# Patient Record
Sex: Female | Born: 1950 | Race: White | Hispanic: No | Marital: Married | Smoking: Never smoker
Health system: Southern US, Community
[De-identification: ages and names within clinical notes are randomized; demographics above are authoritative.]

## PROBLEM LIST (undated history)

## (undated) DIAGNOSIS — K5790 Diverticulosis of intestine, part unspecified, without perforation or abscess without bleeding: Secondary | ICD-10-CM

## (undated) DIAGNOSIS — C801 Malignant (primary) neoplasm, unspecified: Secondary | ICD-10-CM

## (undated) DIAGNOSIS — D219 Benign neoplasm of connective and other soft tissue, unspecified: Secondary | ICD-10-CM

## (undated) DIAGNOSIS — R011 Cardiac murmur, unspecified: Secondary | ICD-10-CM

## (undated) DIAGNOSIS — K8020 Calculus of gallbladder without cholecystitis without obstruction: Secondary | ICD-10-CM

## (undated) DIAGNOSIS — I10 Essential (primary) hypertension: Secondary | ICD-10-CM

## (undated) DIAGNOSIS — N739 Female pelvic inflammatory disease, unspecified: Secondary | ICD-10-CM

## (undated) DIAGNOSIS — M19049 Primary osteoarthritis, unspecified hand: Secondary | ICD-10-CM

## (undated) DIAGNOSIS — R896 Abnormal cytological findings in specimens from other organs, systems and tissues: Secondary | ICD-10-CM

## (undated) DIAGNOSIS — K859 Acute pancreatitis without necrosis or infection, unspecified: Secondary | ICD-10-CM

## (undated) HISTORY — DX: Acute pancreatitis without necrosis or infection, unspecified: K85.90

## (undated) HISTORY — DX: Benign neoplasm of connective and other soft tissue, unspecified: D21.9

## (undated) HISTORY — PX: NASAL SEPTUM SURGERY: SHX37

## (undated) HISTORY — DX: Primary osteoarthritis, unspecified hand: M19.049

## (undated) HISTORY — DX: Essential (primary) hypertension: I10

## (undated) HISTORY — PX: CHOLECYSTECTOMY: SHX55

## (undated) HISTORY — DX: Abnormal cytological findings in specimens from other organs, systems and tissues: R89.6

## (undated) HISTORY — DX: Diverticulosis of intestine, part unspecified, without perforation or abscess without bleeding: K57.90

## (undated) HISTORY — PX: TOOTH EXTRACTION: SUR596

## (undated) HISTORY — PX: COLONOSCOPY: SHX174

## (undated) HISTORY — DX: Malignant (primary) neoplasm, unspecified: C80.1

## (undated) HISTORY — PX: PLANTAR FASCIA RELEASE: SHX2239

## (undated) HISTORY — DX: Cardiac murmur, unspecified: R01.1

## (undated) HISTORY — DX: Female pelvic inflammatory disease, unspecified: N73.9

## (undated) HISTORY — DX: Calculus of gallbladder without cholecystitis without obstruction: K80.20

---

## 1984-03-18 DIAGNOSIS — N739 Female pelvic inflammatory disease, unspecified: Secondary | ICD-10-CM

## 1984-03-18 HISTORY — DX: Female pelvic inflammatory disease, unspecified: N73.9

## 1992-03-18 DIAGNOSIS — IMO0001 Reserved for inherently not codable concepts without codable children: Secondary | ICD-10-CM

## 1992-03-18 HISTORY — DX: Reserved for inherently not codable concepts without codable children: IMO0001

## 1996-03-18 DIAGNOSIS — D219 Benign neoplasm of connective and other soft tissue, unspecified: Secondary | ICD-10-CM

## 1996-03-18 HISTORY — DX: Benign neoplasm of connective and other soft tissue, unspecified: D21.9

## 1998-09-16 HISTORY — PX: BLEPHAROPLASTY: SUR158

## 1998-11-15 ENCOUNTER — Other Ambulatory Visit: Admission: RE | Admit: 1998-11-15 | Discharge: 1998-11-15 | Payer: Self-pay | Admitting: Obstetrics and Gynecology

## 1999-11-15 ENCOUNTER — Other Ambulatory Visit: Admission: RE | Admit: 1999-11-15 | Discharge: 1999-11-15 | Payer: Self-pay | Admitting: Obstetrics and Gynecology

## 2001-03-25 ENCOUNTER — Other Ambulatory Visit: Admission: RE | Admit: 2001-03-25 | Discharge: 2001-03-25 | Payer: Self-pay | Admitting: Obstetrics and Gynecology

## 2002-03-18 HISTORY — PX: CARPAL TUNNEL RELEASE: SHX101

## 2002-04-27 ENCOUNTER — Other Ambulatory Visit: Admission: RE | Admit: 2002-04-27 | Discharge: 2002-04-27 | Payer: Self-pay | Admitting: Obstetrics and Gynecology

## 2003-04-13 ENCOUNTER — Ambulatory Visit (HOSPITAL_COMMUNITY): Admission: RE | Admit: 2003-04-13 | Discharge: 2003-04-13 | Payer: Self-pay | Admitting: Pulmonary Disease

## 2004-05-13 ENCOUNTER — Emergency Department (HOSPITAL_COMMUNITY): Admission: EM | Admit: 2004-05-13 | Discharge: 2004-05-13 | Payer: Self-pay | Admitting: Family Medicine

## 2004-09-24 ENCOUNTER — Ambulatory Visit: Payer: Self-pay | Admitting: Pulmonary Disease

## 2004-11-14 ENCOUNTER — Other Ambulatory Visit: Admission: RE | Admit: 2004-11-14 | Discharge: 2004-11-14 | Payer: Self-pay | Admitting: Obstetrics and Gynecology

## 2004-12-15 IMAGING — CT CT PARANASAL SINUSES LIMITED
1 series · 15 of 30 positions shown, 19 images · IV contrast (omnipaque)
Comparison: none

CLINICAL DATA: Dyspnea.  Chronic sinusitis.  Congestion, wheezing.
TECHNIQUE: Multidetector coronal imaging through the paranasal sinuses performed.  Multidetector helical CT scanning through the chest performed following administration of 100 cc of intravenous Omnipaque 300.  High resolution images of the lungs also performed. 
CT LIMITED SINUSES WITHOUT CONTRAST 04/13/03 
A small amount of mucus in the inferior right maxillary sinus noted.  The remainder of the paranasal sinuses are clear.  Evidence of postsurgical changes in the paranasal sinuses and osteomeatal complexes is noted.  
IMPRESSION
1.  Small amount of mucus in the inferior/anterior right maxillary sinus without other evidence of paranasal sinusitis.
2.  Evidence of previous bilateral sinus/osteomeatal complex surgery.
CT CHEST WITH CONTRAST WITH HIGH RESOLUTION IMAGES OF THE LUNGS 04/13/03 
Heart and great vessels are unremarkable.  No enlarged lymph nodes or pericardial/pleural effusions.  Mild peribronchial thickening is noted without evidence of other interstitial opacities.  No evidence of pulmonary nodules, interlobular septal thickening, or cystic changes.  No focal airspace disease. 
Mild peribronchial thickening without other significant abnormality.  No other evidence or interstitial or airspace disease.

[Series 4: sinus ltd 2.5 h30s · axial · 0.23mm/px · z∈[+1249,+1348]mm · 15 of 40 slices shown, 19 images]
[im 2/40  brain]
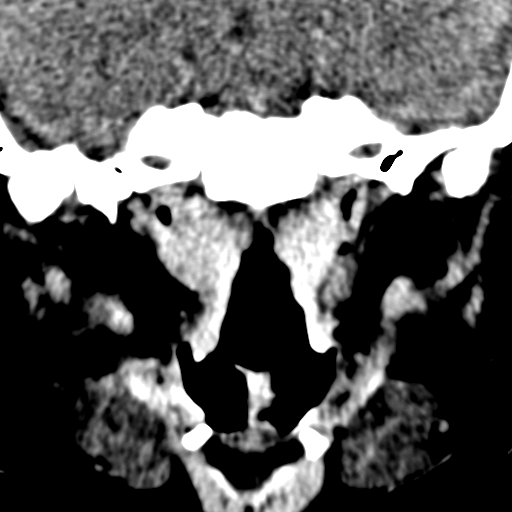
[im 2/40  bone]
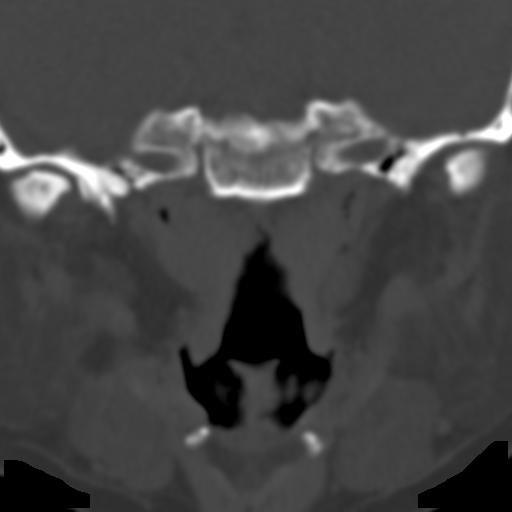
[im 5/40  bone]
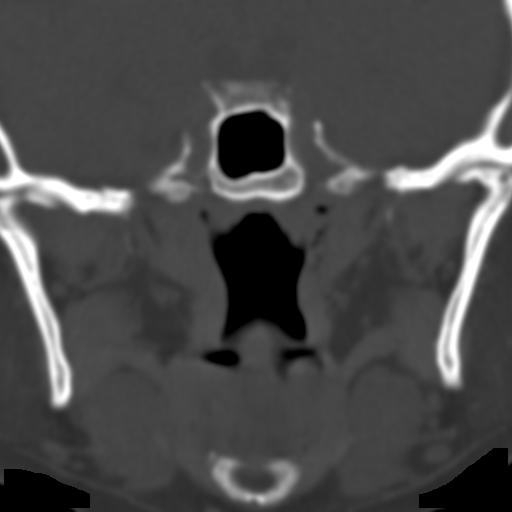
[im 7/40  bone]
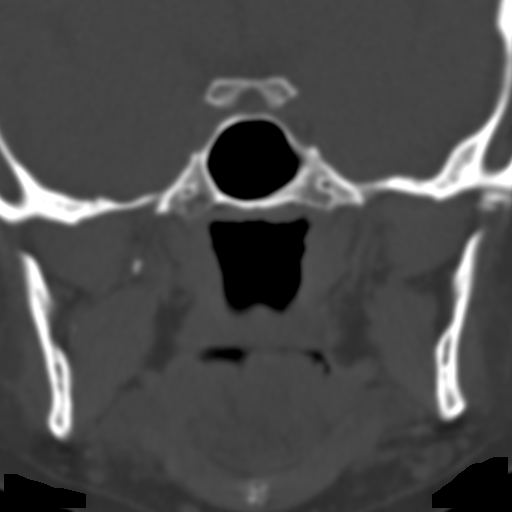
[im 10/40  bone]
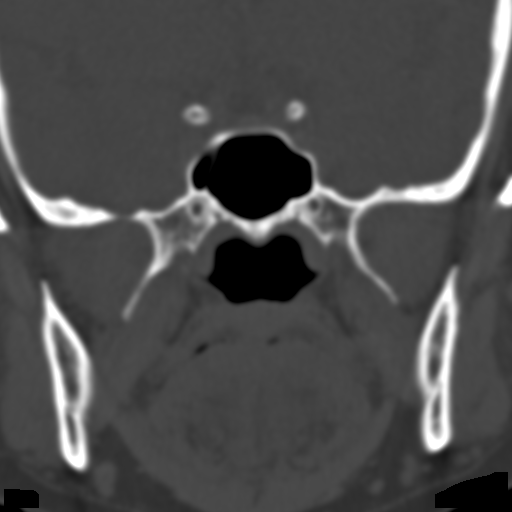
[im 13/40  brain]
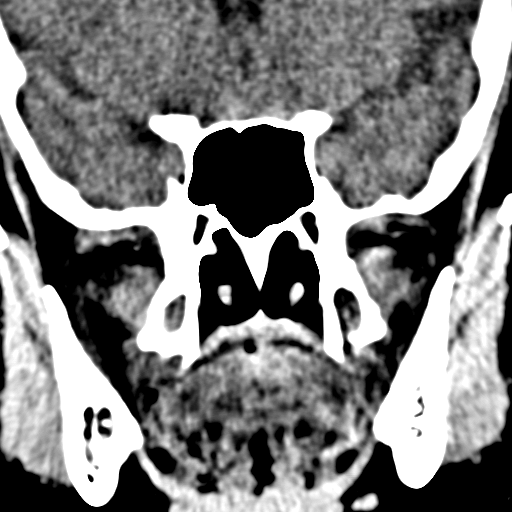
[im 13/40  bone]
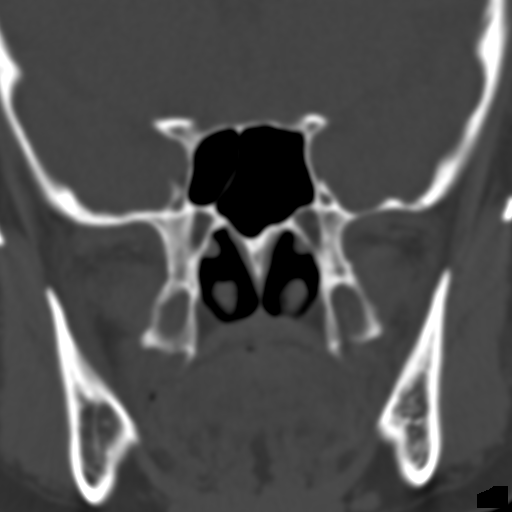
[im 15/40  bone]
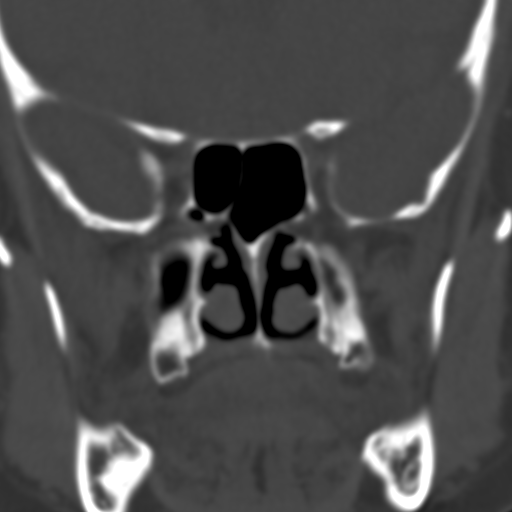
[im 18/40  bone]
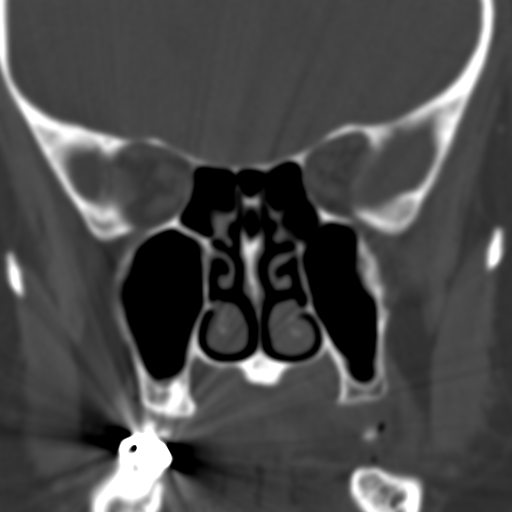
[im 21/40  bone]
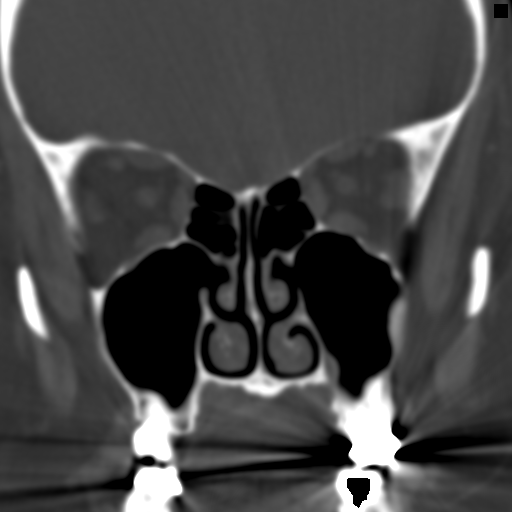
[im 22/40  brain]
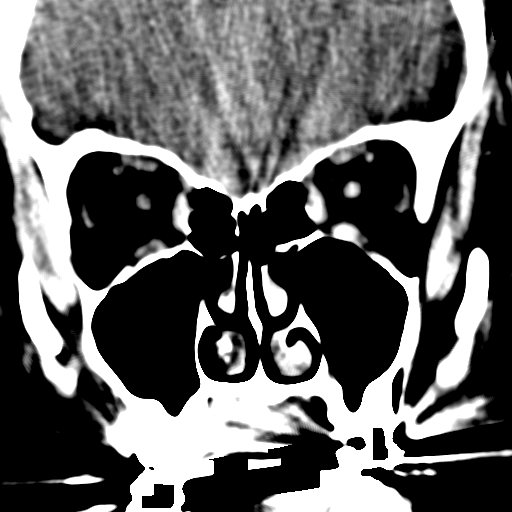
[im 22/40  bone]
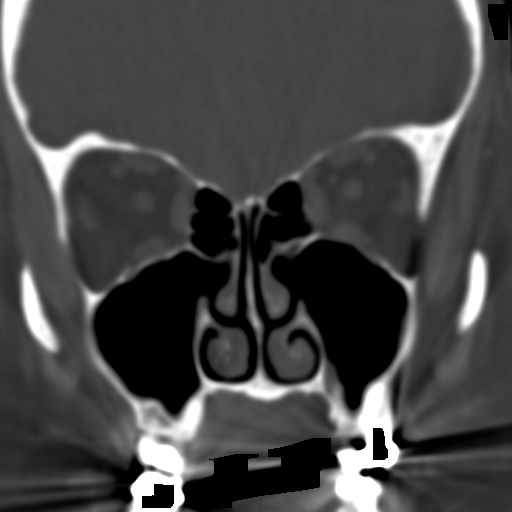
[im 25/40  bone]
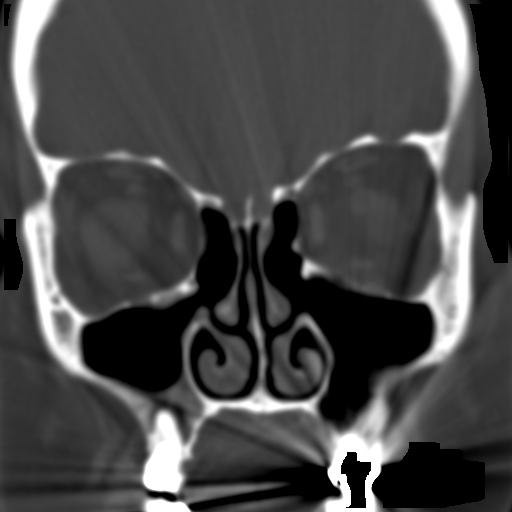
[im 27/40  bone]
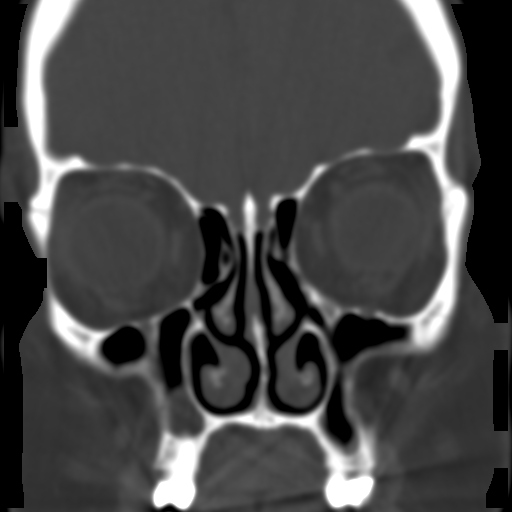
[im 30/40  bone]
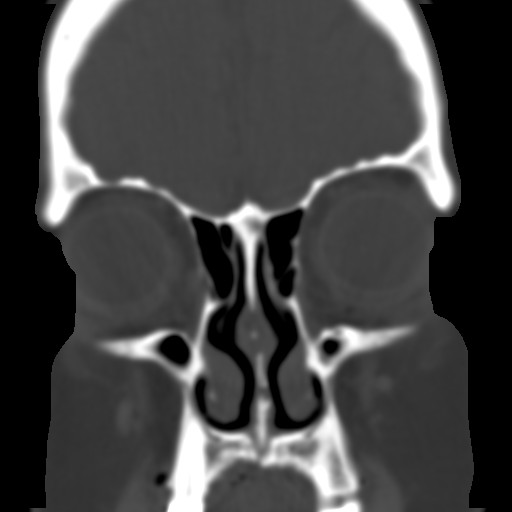
[im 33/40  brain]
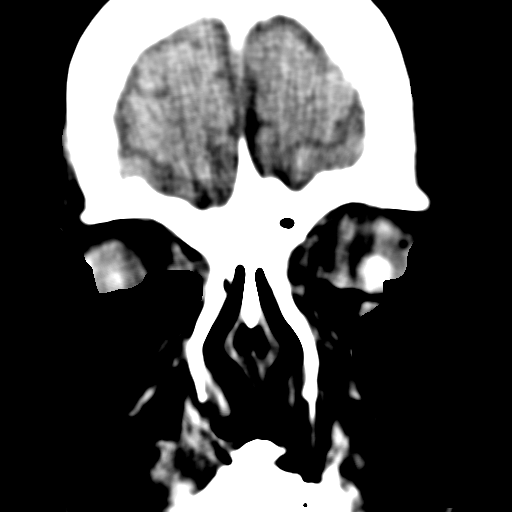
[im 33/40  bone]
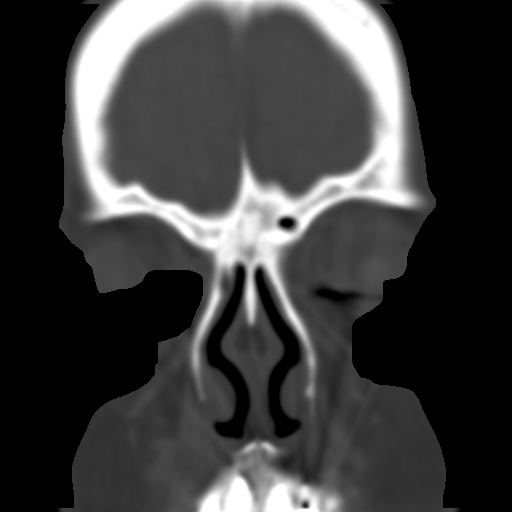
[im 35/40  bone]
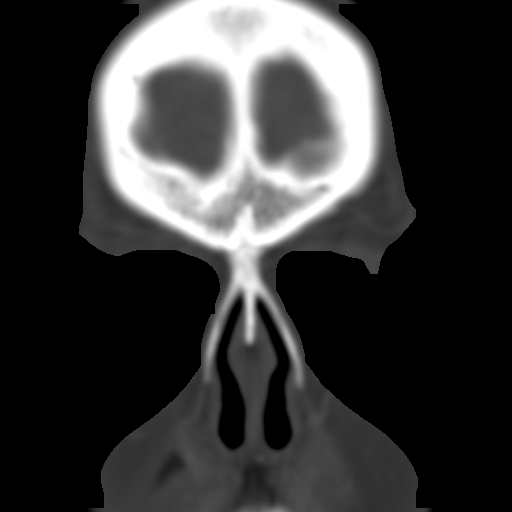
[im 38/40  bone]
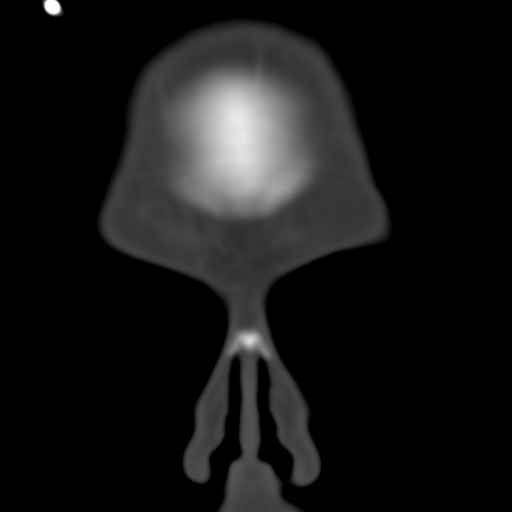

[15 of 30 positions shown; findings below may reference images not displayed]

## 2005-04-18 ENCOUNTER — Ambulatory Visit: Payer: Self-pay | Admitting: Pulmonary Disease

## 2005-07-02 ENCOUNTER — Ambulatory Visit: Payer: Self-pay | Admitting: Pulmonary Disease

## 2005-07-30 ENCOUNTER — Ambulatory Visit: Payer: Self-pay | Admitting: Pulmonary Disease

## 2005-11-15 ENCOUNTER — Other Ambulatory Visit: Admission: RE | Admit: 2005-11-15 | Discharge: 2005-11-15 | Payer: Self-pay | Admitting: Obstetrics and Gynecology

## 2006-06-26 ENCOUNTER — Ambulatory Visit: Payer: Self-pay | Admitting: Pulmonary Disease

## 2006-08-21 ENCOUNTER — Ambulatory Visit: Payer: Self-pay | Admitting: Internal Medicine

## 2006-09-23 ENCOUNTER — Ambulatory Visit: Payer: Self-pay | Admitting: Internal Medicine

## 2007-03-10 ENCOUNTER — Other Ambulatory Visit: Admission: RE | Admit: 2007-03-10 | Discharge: 2007-03-10 | Payer: Self-pay | Admitting: Obstetrics and Gynecology

## 2007-03-19 DIAGNOSIS — C801 Malignant (primary) neoplasm, unspecified: Secondary | ICD-10-CM

## 2007-03-19 HISTORY — DX: Malignant (primary) neoplasm, unspecified: C80.1

## 2007-04-06 DIAGNOSIS — J309 Allergic rhinitis, unspecified: Secondary | ICD-10-CM | POA: Insufficient documentation

## 2007-04-07 ENCOUNTER — Ambulatory Visit: Payer: Self-pay | Admitting: Internal Medicine

## 2007-04-07 DIAGNOSIS — J45909 Unspecified asthma, uncomplicated: Secondary | ICD-10-CM | POA: Insufficient documentation

## 2007-04-20 ENCOUNTER — Encounter: Payer: Self-pay | Admitting: Obstetrics and Gynecology

## 2007-04-20 ENCOUNTER — Ambulatory Visit (HOSPITAL_BASED_OUTPATIENT_CLINIC_OR_DEPARTMENT_OTHER): Admission: RE | Admit: 2007-04-20 | Discharge: 2007-04-20 | Payer: Self-pay | Admitting: Obstetrics and Gynecology

## 2007-05-06 ENCOUNTER — Ambulatory Visit: Admission: RE | Admit: 2007-05-06 | Discharge: 2007-05-06 | Payer: Self-pay | Admitting: Gynecology

## 2007-05-26 ENCOUNTER — Encounter: Payer: Self-pay | Admitting: Gynecology

## 2007-05-26 ENCOUNTER — Inpatient Hospital Stay (HOSPITAL_COMMUNITY): Admission: RE | Admit: 2007-05-26 | Discharge: 2007-05-28 | Payer: Self-pay | Admitting: Obstetrics & Gynecology

## 2007-05-26 HISTORY — PX: ABDOMINAL HYSTERECTOMY: SHX81

## 2007-07-07 ENCOUNTER — Ambulatory Visit: Admission: RE | Admit: 2007-07-07 | Discharge: 2007-07-07 | Payer: Self-pay | Admitting: Gynecologic Oncology

## 2007-09-25 ENCOUNTER — Encounter: Payer: Self-pay | Admitting: Gynecology

## 2007-09-25 ENCOUNTER — Other Ambulatory Visit: Admission: RE | Admit: 2007-09-25 | Discharge: 2007-09-25 | Payer: Self-pay | Admitting: Gynecology

## 2007-09-25 ENCOUNTER — Ambulatory Visit: Admission: RE | Admit: 2007-09-25 | Discharge: 2007-09-25 | Payer: Self-pay | Admitting: Gynecology

## 2007-12-28 ENCOUNTER — Other Ambulatory Visit: Admission: RE | Admit: 2007-12-28 | Discharge: 2007-12-28 | Payer: Self-pay | Admitting: Obstetrics and Gynecology

## 2008-03-15 ENCOUNTER — Other Ambulatory Visit: Admission: RE | Admit: 2008-03-15 | Discharge: 2008-03-15 | Payer: Self-pay | Admitting: Obstetrics and Gynecology

## 2008-04-01 ENCOUNTER — Ambulatory Visit: Admission: RE | Admit: 2008-04-01 | Discharge: 2008-04-01 | Payer: Self-pay | Admitting: Gynecology

## 2008-04-01 ENCOUNTER — Other Ambulatory Visit: Admission: RE | Admit: 2008-04-01 | Discharge: 2008-04-01 | Payer: Self-pay | Admitting: Gynecology

## 2008-04-01 ENCOUNTER — Encounter: Payer: Self-pay | Admitting: Gynecology

## 2008-11-25 ENCOUNTER — Ambulatory Visit: Admission: RE | Admit: 2008-11-25 | Discharge: 2008-11-25 | Payer: Self-pay | Admitting: Gynecology

## 2008-11-25 ENCOUNTER — Encounter: Payer: Self-pay | Admitting: Gynecology

## 2008-11-25 ENCOUNTER — Other Ambulatory Visit: Admission: RE | Admit: 2008-11-25 | Discharge: 2008-11-25 | Payer: Self-pay | Admitting: Gynecology

## 2008-12-06 ENCOUNTER — Encounter: Admission: RE | Admit: 2008-12-06 | Discharge: 2008-12-06 | Payer: Self-pay | Admitting: Neurosurgery

## 2008-12-21 ENCOUNTER — Encounter (INDEPENDENT_AMBULATORY_CARE_PROVIDER_SITE_OTHER): Payer: Self-pay | Admitting: *Deleted

## 2009-01-23 IMAGING — CR DG CHEST 2V
2 series · 2 of 2 positions shown · non-contrast
Comparison: 08/21/06

CLINICAL DATA: 56 year old, preop respiratory exam.   Endometrial cancer.
 CHEST ? 2 VIEW:

[w chest pa]
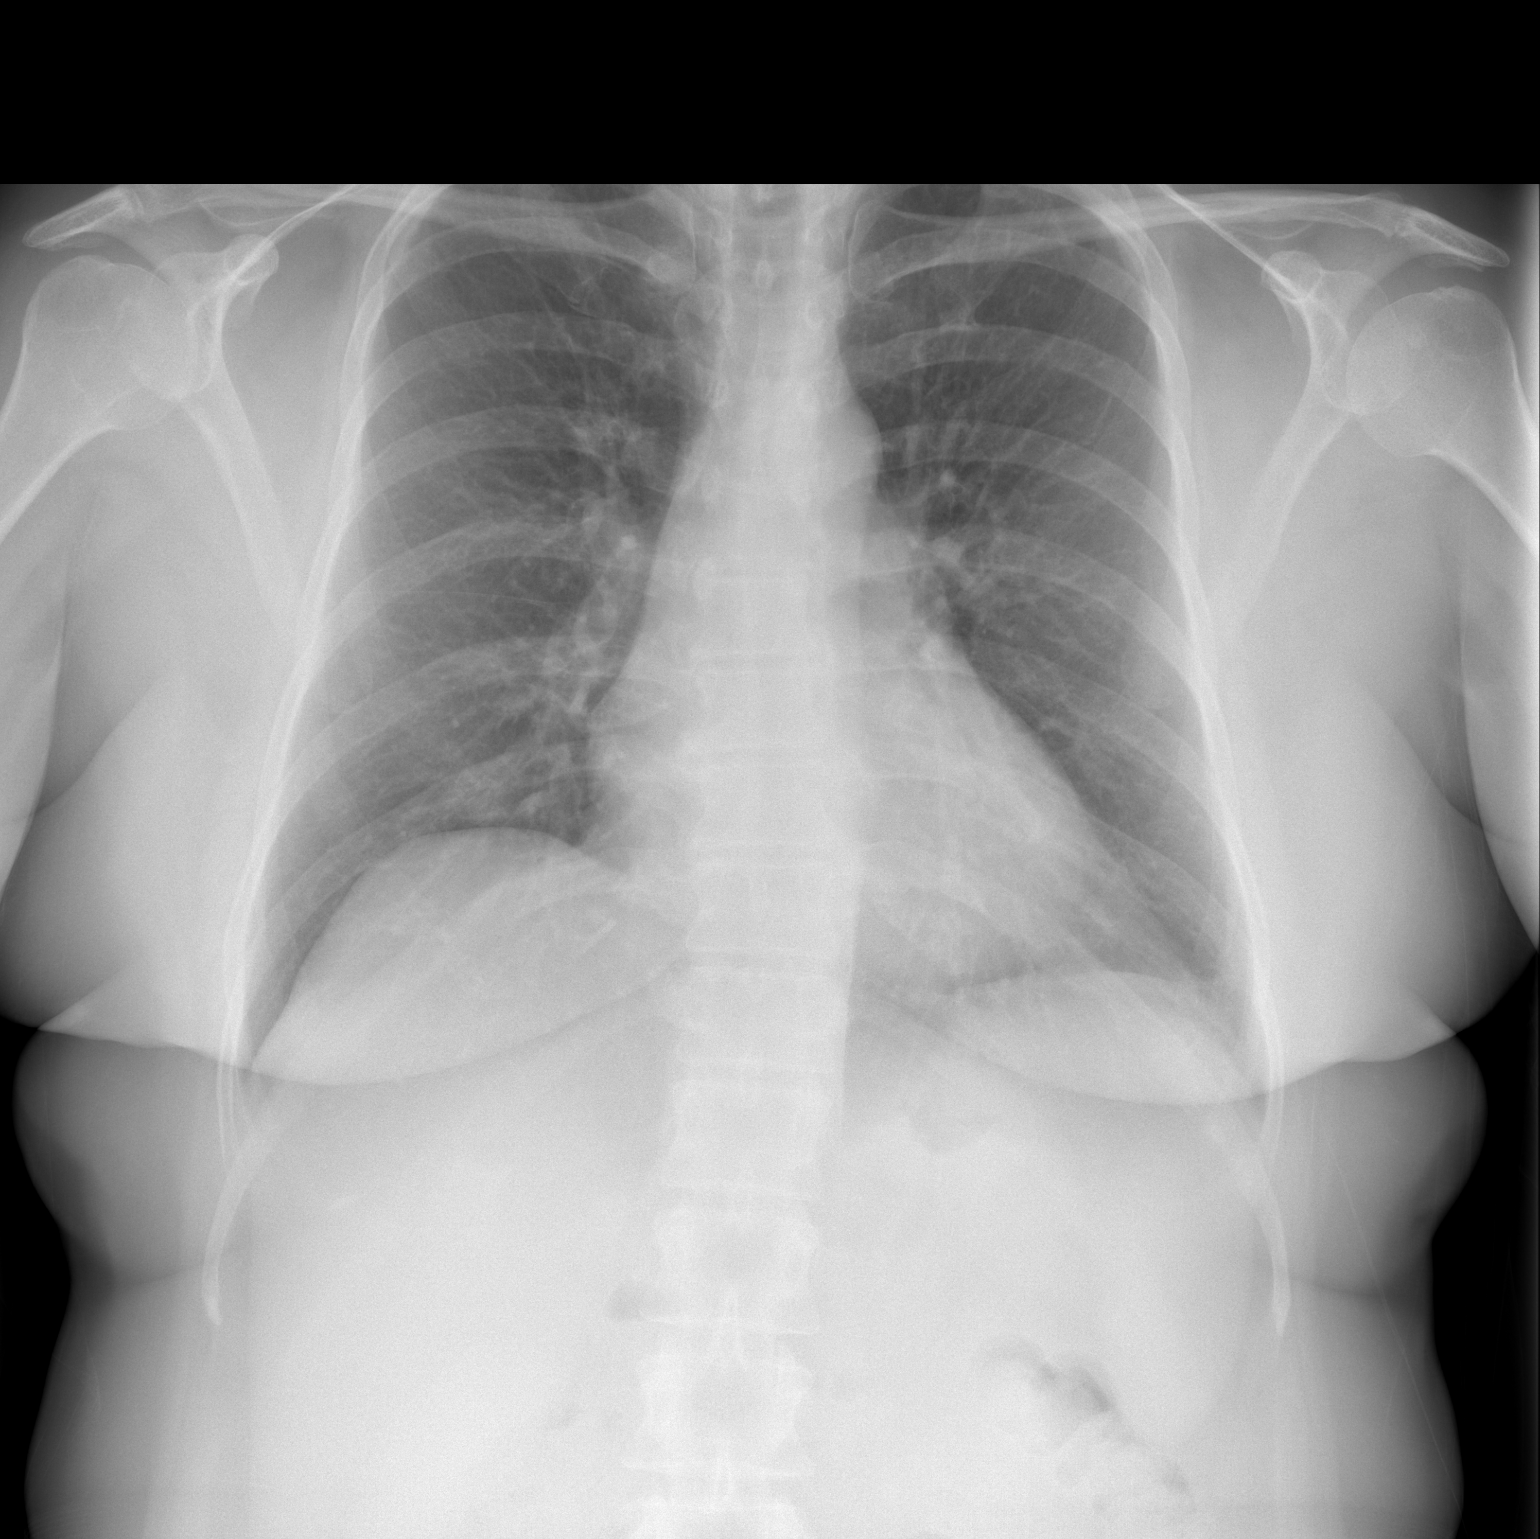

[w chest lat]
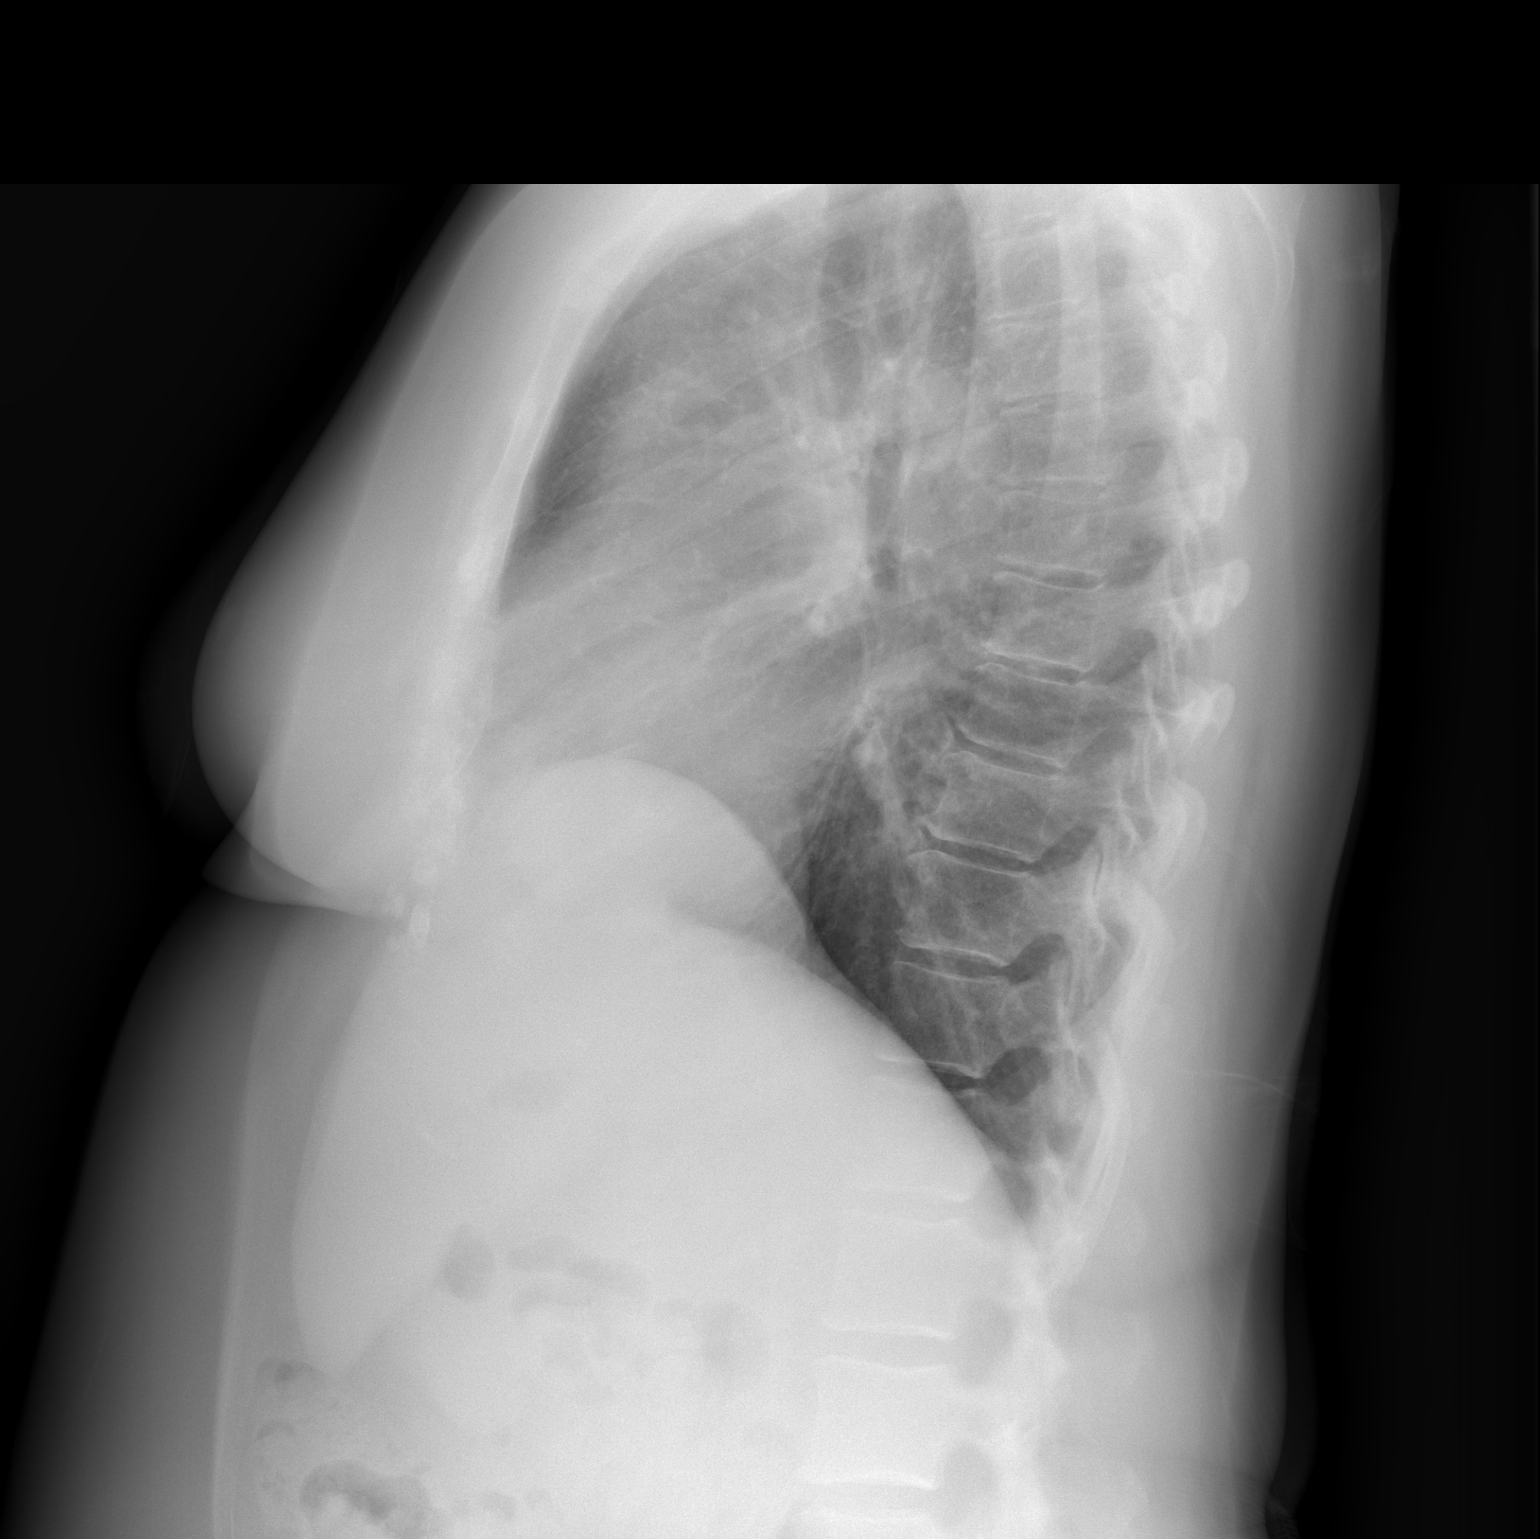

[2 of 2 positions shown; findings below may reference images not displayed]

FINDINGS: The cardiac silhouette, mediastinal and hilar contours are within normal limits and stable.  There are mild chronic bronchitic changes in the lungs but no acute pulmonary findings.  Stable mild eventration of the right hemidiaphragm.  The bony thorax is intact.
IMPRESSION: Mild chronic bronchitic changes which may be related to history of asthma.  No acute pulmonary findings.

## 2009-01-24 ENCOUNTER — Ambulatory Visit: Payer: Self-pay | Admitting: Internal Medicine

## 2009-02-16 ENCOUNTER — Encounter (INDEPENDENT_AMBULATORY_CARE_PROVIDER_SITE_OTHER): Payer: Self-pay | Admitting: *Deleted

## 2009-02-20 ENCOUNTER — Ambulatory Visit: Payer: Self-pay | Admitting: Internal Medicine

## 2009-03-21 ENCOUNTER — Ambulatory Visit: Payer: Self-pay | Admitting: Internal Medicine

## 2009-06-09 ENCOUNTER — Other Ambulatory Visit: Admission: RE | Admit: 2009-06-09 | Discharge: 2009-06-09 | Payer: Self-pay | Admitting: Gynecology

## 2009-06-09 ENCOUNTER — Ambulatory Visit: Admission: RE | Admit: 2009-06-09 | Discharge: 2009-06-09 | Payer: Self-pay | Admitting: Gynecology

## 2010-04-17 NOTE — Procedures (Signed)
Summary: Colonoscopy  Patient: Valerie Wilson Note: All result statuses are Final unless otherwise noted.  Tests: (1) Colonoscopy (COL)   COL Colonoscopy           DONE (C)     [facility]     [address]     [place], [place]  [address]           COLONOSCOPY PROCEDURE REPORT           PATIENT:  [name], [name]  MR#:  [id number]     BIRTHDATE:  [birth year], 58 yrs. old  GENDER:  female           ENDOSCOPIST:  Hedwig Morton. Juanda Chance, MD     Referred by:  Benedetto Goad, MD           PROCEDURE DATE:  03/21/2009     PROCEDURE:  Colonoscopy 19[address]     ASA CLASS:  Class I     INDICATIONS:  Routine Risk Screening last colon in 01/1999, mod     severe diverticulosis     hx of TAH-BSO for uterine cancer 2008           MEDICATIONS:   Fentanyl 100 mcg, Versed 9 mg           DESCRIPTION OF PROCEDURE:   After the risks benefits and     alternatives of the procedure were thoroughly explained, informed     consent was obtained.  Digital rectal exam was performed and     revealed no rectal masses.   The LB CF-H180AL [id number] endoscope     was introduced through the anus and advanced to the cecum, which     was identified by both the appendix and ileocecal valve, without     limitations.  The quality of the prep was good, using MiraLax.     The instrument was then slowly withdrawn as the colon was fully     examined.     <<PROCEDUREIMAGES>>           FINDINGS:  Moderate diverticulosis was found in the sigmoid colon     (see image2, image7, and image3). large deep diverticuli, mildly     narrowed colon lumen,  This was otherwise a normal examination of     the colon (see image8, image6, image5, and image4).   Retroflexed     views in the rectum revealed no abnormalities.    The scope was     then withdrawn from the patient and the procedure completed.           COMPLICATIONS:  None           ENDOSCOPIC IMPRESSION:     1) Moderate diverticulosis in the sigmoid colon     2) Otherwise normal  examination     RECOMMENDATIONS:     1) high fiber diet     add fiber supplements           REPEAT EXAM:  In 10 year(s) for.           ______________________________     Hedwig Morton. Juanda Chance, MD           CC:           n.     REVISED:  03/21/2009 09:36 AM     eSIGNED:   Hedwig Morton. Laney Bagshaw at 03/21/2009 09:36 AM           Starleen Blue, [phone]  Note: An exclamation mark (!) indicates  a result that was not dispersed into the flowsheet. Document Creation Date: 03/21/2009 9:36 AM _______________________________________________________________________  (1) Order result status: Final Collection or observation date-time: 03/21/2009 09:02 Requested date-time:  Receipt date-time:  Reported date-time:  Referring Physician:   Ordering Physician: Lina Sar [id number]) Specimen Source:  Source: Launa Grill Order Number: [id number] Lab site:   Appended Document: Colonoscopy    Clinical Lists Changes  Observations: Added new observation of COLONNXTDUE: 03/2019 (03/21/2009 10:49)

## 2010-07-31 NOTE — Op Note (Signed)
Valerie Wilson, Valerie Wilson                  ACCOUNT NO.:  [id number]   MEDICAL RECORD NO.:  [id number]          PATIENT TYPE:  INP   LOCATION:  1540                         FACILITY:  [facility]   PHYSICIAN:  De Blanch, M.D.DATE OF BIRTH:  1950-07-07   DATE OF PROCEDURE:  05/26/2007  DATE OF DISCHARGE:                               OPERATIVE REPORT   PREOPERATIVE DIAGNOSIS:  Grade 1 endometrial carcinoma.   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   PROCEDURE:  Total abdominal hysterectomy and bilateral salpingo-  oophorectomy.   SURGEON:  De Blanch, M.D.   FIRST ASSISTANT:  Roseanna Rainbow, M.D.  Telford Nab, R.N.   ANESTHESIA:  General with orotracheal tube.   ESTIMATED BLOOD LOSS:  100 mL.   SURGICAL FINDINGS:  At the time of exploratory laparotomy, the upper  abdomen including liver, diaphragm, omentum, stomach, small bowel, colon  and appendix were normal.  There were no enlarged pelvic or periaortic  lymph nodes.  The uterus was distorted by multiple fundal fibroids.  She  seemed to have a left hydrosalpinx.  On frozen section, there was no  myometrial invasion identified.   SURGICAL PROCEDURE:  The patient was brought to the operating room and  after satisfactory attainment of general anesthesia, was placed in the  modified lithotomy position in [place] stirrups.  The anterior abdominal  wall, perineum and vagina were prepped with Betadine.  A Foley catheter  was inserted.  The patient was draped.  The abdomen was entered through  a low midline incision.  Peritoneal washings were obtained.  The upper  abdomen and pelvis were explored with the above noted findings.  The  Bookwalter retractor was assembled and the bowel was packed out of the  pelvis.  The uterus was grasped with long Kelly clamps.  The round  ligaments were divided.  Some adhesions of the left pelvic sidewall were  lysed in order to identify the  infundibulopelvic ligament.  The  retroperitoneal spaces were opened identifying the ureter.  The ovarian  vessels were skeletonized, clamped, cut, free tied and suture ligated.  The bladder flap was incised and then advanced with sharp and blunt  dissection.  The uterine vessels were skeletonized, clamped, cut and  suture ligated in a stepwise fashion. The paracervical and cardinal  ligaments were clamped, cut and suture ligated.  The vaginal angles were  crossclamped and the vagina transected from its connection to the  cervix.  The uterus, cervix, tubes and ovaries were handed off the  operative field for frozen section.   The vaginal cuff was closed with interrupted figure-of-eight sutures of  0 Vicryl.  The pelvis was irrigated and found to be hemostatic. Upon  return of frozen section showing no invasion, the procedure was  terminated.  The packs and retractors were removed.  The anterior  abdominal wall was closed in layers, the first being a running mass  closure  using #1 PDS.  The subcutaneous tissue was irrigated.  Hemostasis was  achieved with cautery.  The skin was closed with skin staples.  A  dressing was applied.  The patient was awakened from anesthesia and  taken to the recovery room in satisfactory condition.  Sponge, needle  and instrument counts were correct x2.      De Blanch, M.D.  Electronically Signed     DC/MEDQ  D:  05/26/2007  T:  05/26/2007  Job:  [id number]   cc:   Edwena Felty. Romine, M.D.  Fax: [phone]

## 2010-07-31 NOTE — Assessment & Plan Note (Signed)
 HEALTHCARE                             PULMONARY OFFICE NOTE   NAME:Valerie Wilson, Valerie Wilson                         MRN:          [id number]  DATE:09/23/2006                            DOB:          1950-08-29    PROBLEMS:  1. Mild persistent asthma.  2. Allergic rhinitis.   HISTORY:  She likes Nasacort, but is concerned about the cost and she  did not try Patanase.  She has not used Pulmicort since last here,  thinking it made her feel tighter in the chest.  She never feels she  needs her rescue inhaler.  No routine sputum.  No sudden events.   MEDICATIONS:  1. Fluoxetine 20 mg.  2. Vivelle-Dot.  3. Prometrium 100 mg.  4. Nasorel.  5. Claritin.  6. Occassional use of an albuterol rescue inhaler.   ALLERGIES:  NO MEDICATION ALLERGY.   OBJECTIVE:  Weight 208 pounds, BP 122/70, pulse regular 86, room air  saturation 98%.  There is bilateral, mild, unlabored wheeze.  Heart  sounds are regular without murmur.  There is no edema or adenopathy.  His airway seems clear without postnasal drip.   Pumonary function testing done September 23, 2006 shows mild obstruction and  small airways, with response to bronchodilator.  Her FEV1 was 2.22  (89%), with FEV1:FPC ratio well maintained at 0.83.  There was mild  restriction of total lung capacity at 70%, and mild reduction of  diffusion at 0.76.   IMPRESSION:  1. Asthma with mild restriction.  2. Rhinitis.   PLAN:  1. I refilled fluticasone as a generic nasal spray, and she is going      to try staying with that.  2. I have explained why it would be to her benefit to try to continue      Pulmicort 1 puff b.i.d.; tried to be more specific about whether it      really causes symptoms, or she just thought it might.  We will stop      it if it is really bothering her.  3. She is encouraged to walk for weight loss and endurance.  4. Schedule return in 6 months, earlier p.r.n.     Clinton D. Maple Hudson, MD, Tonny Bollman, FACP  Electronically Signed    CDY/MedQ  DD: 10/11/2006  DT: 10/12/2006  Job #: [id number]   cc:   Ursula Beath, MD

## 2010-07-31 NOTE — Op Note (Signed)
Valerie Wilson, Valerie Wilson                  ACCOUNT NO.:  [id number]   MEDICAL RECORD NO.:  [id number]          PATIENT TYPE:  AMB   LOCATION:  [facility]                         FACILITY:  [facility]   PHYSICIAN:  Cynthia P. Romine, M.D.DATE OF BIRTH:  1951/02/03   DATE OF PROCEDURE:  04/20/2007  DATE OF DISCHARGE:                               OPERATIVE REPORT   PREOPERATIVE DIAGNOSIS:  1. Postmenopausal bleeding.  2. Known intramural fibroids.  3. Endometrial polyps versus submucous myoma.   POSTOPERATIVE DIAGNOSIS:  1. Postmenopausal bleeding.  2. Known intramural fibroids.  3. Endometrial polyps versus submucous myoma.   PATHOLOGY:  Pending.   PROCEDURE:  Hysteroscopic resection of submucous myoma, curettage and  excision of vulvar skin tag.   SURGEON:  Cynthia P. Romine, M.D.   ANESTHESIA:  General by LMA.   ESTIMATED BLOOD LOSS:  Minimal.   SORBITOL DEFICIT:  165 cc.   COMPLICATIONS:  None.   PROCEDURE:  The patient was taken to the operating room and after  induction of adequate general anesthesia by LMA, she was placed in the  dorsal lithotomy position and prepped and draped in the usual fashion.  The bladder was drained with a red rubber catheter.  A posterior  weighted and anterior Sims retractor were placed, and the cervix was  grasped at the anterior lip with a single-tooth tenaculum.  The uterus  sounded to 7 cm.  The cervix was then dilated to a #31 Shawnie Pons, and the  operative hysteroscope was introduced.  Sorbitol was used as a  distention medium.  Upon entry into the endometrial cavity, it could be  seen that there was an approximately 2 cm submucous myoma from the  fundus.  The single loop was used to resect the myoma in several pieces.  Shaggy polyps were also resected.  Photographic documentation was taken  before and after the resection of the myoma.  The hysteroscope was  removed.  Sharp curettage was carried out, and the specimen was sent  with the myoma to  Pathology.  The  instruments were removed from the vagina, and vulvar skin tags were  excised using the knife and hemostasis was achieved with a Bovie, and  the procedure was terminated.  The patient tolerated it well and went in  satisfactory condition to postanesthesia recovery.      Cynthia P. Romine, M.D.  Electronically Signed     CPR/MEDQ  D:  04/20/2007  T:  04/20/2007  Job:  [id number]

## 2010-07-31 NOTE — Consult Note (Signed)
NAMEWENDE, LONGSTRETH                  ACCOUNT NO.:  [id number]   MEDICAL RECORD NO.:  [id number]          PATIENT TYPE:  OUT   LOCATION:  GYN                          FACILITY:  [facility]   PHYSICIAN:  De Blanch, M.D.DATE OF BIRTH:  September 28, 1950   DATE OF CONSULTATION:  05/06/2007  DATE OF DISCHARGE:                                 CONSULTATION   CHIEF COMPLAINT:  Endometrial cancer.   HISTORY OF PRESENT ILLNESS:  A 60 year old white married female seen in  consultation at the request of Dr. Tresa Res regarding management of a  newly-diagnosed grade 1 endometrial carcinoma.  The patient went through  menopause at approximately age 72 and has not taken any hormone  replacement therapy.  She started having some bleeding approximately 2  months ago and ultimately had an endometrial biopsy showing a well-  differentiated endometrial carcinoma with squamous differentiation and  associated complex atypical hyperplasia.  The patient is having some  spotting at the present time.  She denies any pelvic pain, pressure  vaginal bleeding or discharge.  She has previously been treated with  Prometrium for management of her abnormal bleeding.   FAMILY HISTORY:  Maternal aunt with breast cancer at age 37.  Another  maternal aunt with uterine cancer in her 60s.  There is no history of  ovarian or colon cancer in the family history.   SOCIAL HISTORY:  The patient is married.  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   OBSTETRICAL HISTORY:  Gravida 0.   REVIEW OF SYSTEMS:  A 10-point comprehensive review of systems is  negative except as noted above.   PHYSICAL EXAM:  Height 5 feet 4 inches, weight 200 pounds.  GENERAL:  The patient is pleasant, healthy white female in no acute  distress.  HEENT:  Negative.  NECK:  Supple without thyromegaly.  There is no supraclavicular or inguinal adenopathy.  ABDOMEN:  Mildly obese, soft and nontender.  No mass,  organomegaly,  ascites or hernias are noted.  PELVIC:  EG, BUS, vagina, bladder and urethra are normal.  Cervix is  nulliparous.  There is no bleeding noted.  Uterus is retroverted, normal  shape, size and consistency.  There are no adnexal masses noted.  LOWER EXTREMITIES:  Without edema or varicosities.   IMPRESSION:  Grade 1 endometrial carcinoma.   PLAN:  I would recommend that the patient undergo a total laparoscopic  hysterectomy-bilateral salpingo-oophorectomy.  Intraoperative frozen  section and decision regarding lymph node dissection will be based on  depth of invasion and intraoperative frozen section grading to  complement her D&C specimen.  The pros and cons of pelvic and periaortic  lymphadenectomy were discussed.  The risks of surgery including  hemorrhage, infection,  injury to adjacent viscera, thrombolic complications and anesthetic  risks were outlined.  The patient understands that in scheduling the  surgery, either Dr. Cleda Mccreedy were Dr. Ronita Hipps will likely be the  primary gynecologic oncologist operating.  All of their questions were  answered and we will schedule surgery in the near future.  De Blanch, M.D.  Electronically Signed     DC/MEDQ  D:  05/06/2007  T:  05/07/2007  Job:  [id number]   cc:   Aram Beecham P. Romine, M.D.  Fax: [phone]   Telford Nab, R.N.  [address]  [place], [place] [address]   Dr. Claiborne Rigg [facility]   Gloriajean Dell. Andrey Campanile, M.D.  Fax: [phone]   Kinnie Feil, MD  [facility]

## 2010-07-31 NOTE — Consult Note (Signed)
Valerie Wilson, Valerie Wilson                  ACCOUNT NO.:  [id number]   MEDICAL RECORD NO.:  [id number]          PATIENT TYPE:  OUT   LOCATION:  GYN                          FACILITY:  [facility]   PHYSICIAN:  De Blanch, M.D.DATE OF BIRTH:  [birth year]   DATE OF CONSULTATION:  09/25/2007  DATE OF DISCHARGE:                                 CONSULTATION   CHIEF COMPLAINT:  Endometrial cancer.   INTERVAL HISTORY:  Since her last visit the patient has done well.  She  denies any GI symptoms.  She has had occasional urinary tract urgency  and rare incontinence.  She denies any vaginal bleeding or any pelvic  pain or pressure.  Her functional status has been excellent.   HISTORY OF PRESENT ILLNESS:  The patient underwent a total abdominal  hysterectomy, bilateral salpingo-oophorectomy May 26, 2007.  Final  pathology showed a grade 1, stage 1b endometrial adenocarcinoma.  No  adjuvant therapy was recommended.   PAST MEDICAL HISTORY/MEDICAL ILLNESSES:  None.   PAST SURGICAL HISTORY:  None.   FAMILY HISTORY:  Family history reveals a maternal aunt with breast  cancer and another maternal aunt with endometrial carcinoma.  There is  no history of ovarian or colon cancer in the family history.   SOCIAL HISTORY:  The patient is married.  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   OBSTETRICAL HISTORY:  Gravida 0.   REVIEW OF SYSTEMS:  A 10-point comprehensive Review of Systems negative  except as noted above.   PHYSICAL EXAMINATION:  VITAL SIGNS:  Weight 195 pounds, blood pressure  116/82, pulse 80, respiratory rate 20.  GENERAL:  Patient is a healthy white female in no acute distress.  HEENT:  Negative.  NECK:  Supple without thyromegaly.  There is no supraclavicular or  inguinal adenopathy.  ABDOMEN:  Soft, nontender.  No mass, organomegaly, ascites or hernias  noted.  Midline incision is well-healed.  PELVIC:  EGBUS, vagina,  urethra are normal.   Cervix, uterus surgically absent.  Adnexa without  masses.  Rectovaginal exam confirms lower extremities without edema or  varicosities.   IMPRESSION:  Stage Ib, grade 1 endometrial cancer.  No evidence of  recurrent disease.   PLAN:  Pap smears were obtained.  The patient will return to see Dr.  Tresa Res in three months.  Return to see [place] in six months.      De Blanch, M.D.  Electronically Signed     DC/MEDQ  D:  09/25/2007  T:  09/25/2007  Job:  [id number]   cc:   Telford Nab, R.N.  [address]  [place], [place] [address]   Edwena Felty. Romine, M.D.  Fax: [phone]

## 2010-07-31 NOTE — Consult Note (Signed)
Valerie Wilson, Valerie Wilson                  ACCOUNT NO.:  [id number]   MEDICAL RECORD NO.:  [id number]          PATIENT TYPE:  OUT   LOCATION:  GYN                          [facility]ACILITY:  [facility]   PHYSICIAN:  De Blanch, M.D.DATE OF BIRTH:  1950/12/08   DATE OF CONSULTATION:  04/01/2008  DATE OF DISCHARGE:                                 CONSULTATION   CHIEF COMPLAINT:  Endometrial cancer.   INTERVAL HISTORY:  The patient returns today for continuing follow-up.  Since her last visit she has done well.  She saw Dr. Tresa Res  approximately 3 months ago.  She denies any pelvic pain, pressure,  vaginal bleeding or discharge.  Her functional status is excellent.   HISTORY OF PRESENT ILLNESS:  Stage IB grade 1 endometrial cancer,  undergoing initial surgical resection and staging March 2009.  No  adjuvant therapy was recommended.   PAST MEDICAL HISTORY AND MEDICAL ILLNESSES:  None.   PAST SURGICAL HISTORY:  TAH-BSO March 2009.   FAMILY HISTORY:  Maternal aunt with breast cancer, another maternal aunt  with endometrial cancer.  No history of ovarian or colon cancer in the  family.   SOCIAL HISTORY:  The patient is married.  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   OBSTETRICAL HISTORY:  Gravida 0.   REVIEW OF SYSTEMS:  A 10-point coverage review of systems negative  except as noted above.   PHYSICAL EXAM:  Weight 202 pounds, blood pressure 127/63, pulse 66.  GENERAL:  The patient is a healthy white female, in no acute distress.  HEENT:  Negative.  NECK:  Supple without thyromegaly. There is no supraclavicular or  inguinal adenopathy.  ABDOMEN:  Soft, nontender.  No mass, organomegaly, ascites or hernias  noted.  Midline incision is well-healed.  PELVIC:  EG, BUS, vagina, bladder, urethra are normal.  Cervix and  uterus surgically absent.  Adnexa without masses, rectovaginal exam  confirms.  LOWER EXTREMITIES:  Without edema or varicosities.   IMPRESSION:  Stage IB grade 1 endometrial cancer March 2009.  No  evidence of recurrent disease.   PLAN:  Pap smears were obtained.   The patient will see Dr. Tresa Res in 3 months.  Thereafter, we will  schedule the patient to be seen every 6 months, alternating with Dr.  Tresa Res.  Therefore, I will see the patient in approximately 9 months.      De Blanch, M.D.  Electronically Signed     DC/MEDQ  D:  04/01/2008  T:  04/01/2008  Job:  [id number]   cc:   Edwena Felty. Romine, M.D.  Fax: [phone]   Telford Nab, R.N.  [address]  [place], [place] [address]

## 2010-07-31 NOTE — Consult Note (Signed)
Valerie Wilson, Valerie Wilson                  ACCOUNT NO.:  [id number]   MEDICAL RECORD NO.:  [id number]          PATIENT TYPE:  OUT   LOCATION:  GYN                          FACILITY:  [facility]   PHYSICIAN:  John T. Kyla Balzarine, M.D.    DATE OF BIRTH:  1950/12/26   DATE OF CONSULTATION:  07/07/2007  DATE OF DISCHARGE:                                 CONSULTATION   CHIEF COMPLAINT:  Postoperative followup after hysterectomy for stage  IB, grade I endometrial cancer.   HISTORY OF PRESENT ILLNESS:  This patient had total abdominal  hysterectomy/BSO on March 10 which disclosed stage IB grade I  endometrioid adenocarcinoma.  She was considered low risk and no lymph  nodes were dissected.  She has had essentially an uneventful  convalescence from surgery, having an absence of problems with her  incision.  She denies vaginal bleeding or leg swelling.  She does note  some urinary urgency, but this is improving.  Bowel functions have  normalized.   PHYSICAL EXAMINATION:  Weight 200 pounds, blood pressure 136/77.  ABDOMEN: The abdomen is obese, soft and benign with well-healed  incision.  No erythema or hernia palpable.  Appropriate tenderness.  EXTREMITIES:  Full strength and range of motion.  No edema, cords or  Homan's.  BACK:  No spinous or CVA tenderness.  PELVIC:  External genitalia and BUS, bladder and urethra and vagina are  clear with healing cuff.  On bimanual and rectovaginal examinations,  minimal post op induration of the cuff.   ASSESSMENT:  Stage IB grade I endometrioid adenocarcinoma post  hysterectomy and BSO.   PLAN:  The patient will complete convalescence.  We discussed physical  limitations.  She was given a prescription for Premarin 0.625 mg to be  used daily, but was counseled that she should use this for control of  hot flashes and  should anticipate tapering off over the next couple of years.  We  discussed is potential sites of recurrence and discussed surveillance  strategies.   The patient will see Dr. Loree Fee back for follow-up  in 3 months and Dr. Tresa Res in 6 months.      John T. Kyla Balzarine, M.D.  Electronically Signed     JTS/MEDQ  D:  07/07/2007  T:  07/07/2007  Job:  [id number]   cc:   Edwena Felty. Romine, M.D.  Fax: [phone]   Telford Nab, R.N.  [address]  [place], [place] [address]   Roseanna Rainbow, M.D.  Fax: [phone]

## 2010-07-31 NOTE — Assessment & Plan Note (Signed)
[facility]                             PULMONARY OFFICE NOTE   NAME:Valerie Wilson, Valerie Wilson                         MRN:          [id number]  DATE:08/21/2006                            DOB:          1950-11-05    PROBLEM:  1. Mild persistent asthma.  2. Allergic rhinitis.   HISTORY:  This is a never smoker, previously followed at this office by  Dr. Jayme Cloud, who is leaving.  She wishes to establish with me.  Her  primary physician is Dr. Raquel James, who has taken over for Dr. Dara Hoyer at [facility].  She describes history of  seasonal allergic rhinitis, which has become nonseasonal in recent  years.  She has been taking Claritin D on a longterm basis.  Triggers  for nasal and chest congestion include exposure to dogs (has none),  seasonal pollens, weather changes, and probably other unidentified  factors.  She was skin tested 20 years ago.  Does not remember the  results, but was not on allergy vaccine.  [place]  No family  history of allergy or asthma problems.  Recently stable with no acute  concern.   MEDICATIONS:  1. Pulmicort 2 puffs b.i.d.  2. Nasarel used intermittently.  3. Fluoxetine 20 mg.  4. Vivelle.  5. Prometrium.  6. Mucinex p.r.n.  7. Zolpidem p.r.n.  8. Albuterol inhaler p.r.n.  9. Claritin D p.r.n.   NO MEDICATION ALLERGY.   Simple spirometry in April 2007 showed mild reduction of small airway  flows with an FEV1 of 2.16 (83% of predicted).   OBJECTIVE:  Weight 205 pounds.  BP 142/90.  Pulse 69.  Room air  saturation 98%.  She looks comfortable.  There is mild turbinate edema.  Mucosa is not pale.  There is no  significant mucus bridging.  No visible polyps.  Her pharynx looks  clear.  Voice quality is normal.  CHEST:  Clear.  Breathing is unlabored.  HEART:  Sounds regular and normal.   IMPRESSION:  1. Rhinitis.  2. Mild intermittent asthma.   PLAN:  1. Pulmonary function tests.  2.  Chest x-ray.  3. Try changing Nasarel to Nasacort AQ 2 sprays each nostril daily.  4. Try Patanase 1 spray each nostril b.i.d. p.r.n. with discussion.  5. She was given permission to try off Pulmicort to assess what it is      really doing for her.  6. Schedule return 1 month, earlier p.r.n.     Clinton D. Maple Hudson, MD, Tonny Bollman, FACP  Electronically Signed    CDY/MedQ  DD: 08/23/2006  DT: 08/23/2006  Job #: [id number]   cc:   Ursula Beath, MD

## 2010-08-03 NOTE — Discharge Summary (Signed)
Valerie Wilson, CLAIR                  ACCOUNT NO.:  [id number]   MEDICAL RECORD NO.:  [id number]          PATIENT TYPE:  INP   LOCATION:  1535                         FACILITY:  [facility]   PHYSICIAN:  Roseanna Rainbow, M.D.DATE OF BIRTH:  April 08, 1950   DATE OF ADMISSION:  05/26/2007  DATE OF DISCHARGE:  05/28/2007                               DISCHARGE SUMMARY   CHIEF COMPLAINT:  The patient is a 60 year old Caucasian female with a  newly diagnosed grade 1 endometrial carcinoma, now for surgical  management.  Please see the dictated history and physical as per Dr.  Stanford Breed for further details.   HOSPITAL COURSE:  The patient was admitted and underwent a total  abdominal hysterectomy and bilateral salpingo-oophorectomy.  Please see  the dictated operative summary.  On postoperative day #1, her hemoglobin  was 11.3; a basic metabolic profile was normal.  She was doing well.  The remainder of her hospital course was uneventful.  She was discharged  to home on postoperative day #2 tolerating a regular diet.   DISCHARGE DIAGNOSIS:  Stage 1B endometrioid adenocarcinoma uterine.   PROCEDURE:  Total abdominal hysterectomy and bilateral salpingo-  oophorectomy.   CONDITION:  Good.   DISCHARGE DIET:  Regular.   ACTIVITY:  Progressive activity, pelvic rest.   DISCHARGE MEDICATIONS:  Percocet as needed.   DISPOSITION:  The patient was to follow up on March 16 at 10:00 a.m. for  staple removal at the GYN/Oncology office.      Roseanna Rainbow, M.D.  Electronically Signed     LAJ/MEDQ  D:  06/09/2007  T:  06/09/2007  Job:  [id number]   cc:   De Blanch, M.D.  [address]  [place]  [place] [address]   Edwena Felty. Romine, M.D.  Fax: [phone]   Telford Nab, R.N.  [address]  [place], [place] [address]   Gloriajean Dell. Andrey Campanile, M.D.  Fax: [phone]   Kinnie Feil, M.D.  [facility]

## 2010-08-03 NOTE — Assessment & Plan Note (Signed)
[facility]                             PULMONARY OFFICE NOTE   NAME:Goering, Valerie Wilson                         MRN:          [id number]  DATE:06/26/2006                            DOB:          March 10, 1951    This is a very pleasant 60 year old white female who I have not seen  here since April 2007.  She has mild persistent asthma.  She also has  difficulties with chronic issues with allergic rhinitis and occasional  rhinosinusitis.  She just finished treatment with azithromycin Z-Pak and  prednisone.  She voices no complaints.  She is maintained on Foradil one  capsule inhaled twice a day during months of persistent flare, and  Pulmicort 180 mcg two puffs twice a day.  She does use Pulmicort year-  round without interruption.  The patient, in addition, is on Nasarel  which she also uses for her nasal symptoms on an as-needed basis.   CURRENT MEDICATIONS:  Are as noted on the intake sheet.  These have been  reviewed and are accurate.   PHYSICAL EXAMINATION:  VITAL SIGNS:  As noted.  Oxygen saturation is 975  on room air.  GENERAL:  This is a well-developed, somewhat obese female who is in no  acute distress.  HEENT:  Unremarkable.  She does have some mild turbinate edema  bilaterally.  NECK:  Supple, no adenopathy noted, no JVD.  LUNGS:  Clear.  CARDIAC:  Regular rate, rhythm.  No rubs, murmurs, or gallops heard.  EXTREMITIES:  The patient has no cyanosis, no clubbing, no edema noted.   We did perform spirometry today and this shows that her previously  decreased mid flows are actually totally normal.  The remainder of her  flows are also normal.   IMPRESSION:  1. Mild persistent asthma.  The patient is well compensated on her      current regimen.  The plan will be for her to continue regimen as      is, basically Pulmicort year-round, Nasacort and Foradil will be      used during episodes of flare.  She does note that her Foradil is      not a  rescue medication but actually to be used regularly during      the times when she has issues with flaring.  2. Followup will be here with me on a p.r.n. basis.  She has been      notified that I will be leaving the practice.  I am referring her      to Dr. Jetty Duhamel for ongoing followup of her asthma and also      management of her allergies.     Gailen Shelter, MD  Electronically Signed    CLG/MedQ  DD: 06/26/2006  DT: 06/26/2006  Job #: [id number]   cc:   Teena Irani. Arlyce Dice, M.D.

## 2010-11-02 ENCOUNTER — Other Ambulatory Visit: Payer: Self-pay | Admitting: Gynecology

## 2010-11-02 ENCOUNTER — Other Ambulatory Visit (HOSPITAL_COMMUNITY)
Admission: RE | Admit: 2010-11-02 | Discharge: 2010-11-02 | Disposition: A | Discharge status: Home / Self Care | Payer: BC Managed Care – PPO | Source: Ambulatory Visit | Attending: Gynecology | Admitting: Gynecology

## 2010-11-02 ENCOUNTER — Ambulatory Visit: Payer: BC Managed Care – PPO | Attending: Gynecology | Admitting: Gynecology

## 2010-11-02 DIAGNOSIS — C549 Malignant neoplasm of corpus uteri, unspecified: Secondary | ICD-10-CM | POA: Insufficient documentation

## 2010-11-02 DIAGNOSIS — Z79899 Other long term (current) drug therapy: Secondary | ICD-10-CM | POA: Insufficient documentation

## 2010-11-02 DIAGNOSIS — Z803 Family history of malignant neoplasm of breast: Secondary | ICD-10-CM | POA: Insufficient documentation

## 2010-11-02 DIAGNOSIS — Z8049 Family history of malignant neoplasm of other genital organs: Secondary | ICD-10-CM | POA: Insufficient documentation

## 2010-11-02 DIAGNOSIS — I10 Essential (primary) hypertension: Secondary | ICD-10-CM | POA: Insufficient documentation

## 2010-11-02 DIAGNOSIS — Z8540 Personal history of malignant neoplasm of unspecified female genital organ: Secondary | ICD-10-CM | POA: Insufficient documentation

## 2010-11-02 DIAGNOSIS — Z90710 Acquired absence of both cervix and uterus: Secondary | ICD-10-CM | POA: Insufficient documentation

## 2010-11-02 DIAGNOSIS — Z9079 Acquired absence of other genital organ(s): Secondary | ICD-10-CM | POA: Insufficient documentation

## 2010-11-05 NOTE — Consult Note (Signed)
  Valerie Wilson, Valerie Wilson                  ACCOUNT NO.:  [id number]  MEDICAL RECORD NO.:  [id number]  LOCATION:  GYN                          FACILITY:  [facility]  PHYSICIAN:  De Blanch, M.D.DATE OF BIRTH:  [birth year]  DATE OF CONSULTATION:  11/02/2010 DATE OF DISCHARGE:                                CONSULTATION   CHIEF COMPLAINT:  Endometrial cancer.  INTERVAL HISTORY:  The patient returns today for continuing followup of a stage IB adenocarcinoma of the endometrium, initially diagnosed and treated in March 2009.  Since her last visit, the patient has done well. She saw Dr. Tresa Res approximately 6 months ago.  She denies any GI or GU symptoms.  Has no pelvic pain, pressure, vaginal bleeding, or discharge. Her overall functional status is excellent.  HISTORY OF PRESENT ILLNESS:  Stage IB, grade 1 endometrial carcinoma, undergoing initial surgical staging March 2009.  She was found to be at low-risk and did not require any adjuvant therapy.  PAST MEDICAL HISTORY/MEDICAL ILLNESSES:  Hypertension.  PAST SURGICAL HISTORY:  TAH-BSO in March 2009.  FAMILY HISTORY:  Maternal aunts with breast cancer.  Paternal aunt with endometrial cancer.  SOCIAL HISTORY:  The patient is married.  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  CURRENT MEDICATIONS:  Ramipril and Claritin.  OBSTETRICAL HISTORY:  Gravida 0.  REVIEW OF SYSTEMS:  A 10-point comprehensive review of systems negative except as noted above.  PHYSICAL EXAMINATION:  VITAL SIGNS:  Weight 193 pounds, blood pressure 130/70.  Remainder of vital signs are on her chart. GENERAL:  Patient is a healthy white female, in no acute distress. HEENT:  Negative. NECK:  Supple without thyromegaly.  There is no supraclavicular or inguinal adenopathy. ABDOMEN:  Soft, nontender.  No mass, organomegaly, ascites, or hernias noted.  Midline incision is well healed. PELVIC:  EGBUS, vagina, bladder, urethra  are normal.  Cervix and uterus are surgically absent.  Adnexa without masses.  Rectovaginal exam confirms. LOWER EXTREMITIES:  Without edema or varicosities.  IMPRESSION:  Stage IB, grade 1 endometrial carcinoma.  Patient remains clinically free of disease.  PLAN:  Past smears were obtained.  Now with 3-1/2 years of followup, we will release the patient to Dr. Tresa Res for continuing care.  We would suggest that she have an exam annually.     De Blanch, M.D.     DC/MEDQ  D:  11/02/2010  T:  11/03/2010  Job:  [id number]  cc:   Aram Beecham P. Romine, M.D. Fax: [phone]  Telford Nab, R.N. [address] [place], [place] [address]  Electronically Signed by De Blanch M.D. on 11/05/2010 11:23:04 AM

## 2010-12-07 LAB — BASIC METABOLIC PANEL
BUN: 12
CO2: 26
Calcium: 9.2
Chloride: 107
Creatinine, Ser: 0.63
GFR calc Af Amer: >60
GFR calc non Af Amer: >60
Glucose, Bld: 109 — ABNORMAL HIGH
Potassium: 4
Sodium: 140

## 2010-12-07 LAB — CBC
HCT: 38.2
Hemoglobin: 13.3
MCHC: 34.8
MCV: 87.3
Platelets: 269
RBC: 4.37
RDW: 13.7
WBC: 6.5

## 2010-12-10 LAB — CBC
HCT: 32.1 — ABNORMAL LOW
HCT: 37.5
Hemoglobin: 11.4 — ABNORMAL LOW
Hemoglobin: 13.2
MCHC: 35.2
MCHC: 35.5
MCV: 86.9
MCV: 86.9
Platelets: 260
Platelets: 266
RBC: 3.69 — ABNORMAL LOW
RBC: 4.31
RDW: 13.3
WBC: 11.3 — ABNORMAL HIGH
WBC: 8.8

## 2010-12-10 LAB — COMPREHENSIVE METABOLIC PANEL
ALT: 30
AST: 21
Albumin: 3.9
Alkaline Phosphatase: 101
BUN: 17
CO2: 27
Calcium: 9.4
Chloride: 105
Creatinine, Ser: 0.67
GFR calc Af Amer: >60
GFR calc non Af Amer: >60
Glucose, Bld: 89
Potassium: 3.9
Sodium: 141
Total Bilirubin: 0.8
Total Protein: 7.1

## 2010-12-10 LAB — BASIC METABOLIC PANEL
BUN: 7
Calcium: 8.5
Chloride: 104
Creatinine, Ser: 0.57
GFR calc Af Amer: >60
GFR calc non Af Amer: >60
Glucose, Bld: 126 — ABNORMAL HIGH
Sodium: 136

## 2010-12-10 LAB — DIFFERENTIAL
Basophils Relative: 0
Eosinophils Relative: 3
Lymphocytes Relative: 24
Lymphs Abs: 2.1
Monocytes Relative: 6
Neutrophils Relative %: 66

## 2010-12-10 LAB — ABO/RH: ABO/RH(D): O POS

## 2010-12-10 LAB — TYPE AND SCREEN: ABO/RH(D): O POS

## 2012-08-25 ENCOUNTER — Encounter: Payer: Self-pay | Admitting: Obstetrics & Gynecology

## 2012-08-26 ENCOUNTER — Encounter: Payer: Self-pay | Admitting: Gynecology

## 2012-08-26 ENCOUNTER — Ambulatory Visit: Payer: Self-pay | Admitting: Obstetrics and Gynecology

## 2012-08-26 ENCOUNTER — Ambulatory Visit (INDEPENDENT_AMBULATORY_CARE_PROVIDER_SITE_OTHER): Payer: BC Managed Care – PPO | Admitting: Gynecology

## 2012-08-26 ENCOUNTER — Ambulatory Visit: Payer: Self-pay | Admitting: Gynecology

## 2012-08-26 VITALS — BP 138/76 | Ht 64.5 in | Wt 182.0 lb

## 2012-08-26 DIAGNOSIS — Z8049 Family history of malignant neoplasm of other genital organs: Secondary | ICD-10-CM

## 2012-08-26 DIAGNOSIS — Z0000 Encounter for general adult medical examination without abnormal findings: Secondary | ICD-10-CM

## 2012-08-26 DIAGNOSIS — Z803 Family history of malignant neoplasm of breast: Secondary | ICD-10-CM

## 2012-08-26 DIAGNOSIS — C549 Malignant neoplasm of corpus uteri, unspecified: Secondary | ICD-10-CM

## 2012-08-26 DIAGNOSIS — Z8542 Personal history of malignant neoplasm of other parts of uterus: Secondary | ICD-10-CM

## 2012-08-26 DIAGNOSIS — C541 Malignant neoplasm of endometrium: Secondary | ICD-10-CM | POA: Insufficient documentation

## 2012-08-26 DIAGNOSIS — Z01419 Encounter for gynecological examination (general) (routine) without abnormal findings: Secondary | ICD-10-CM

## 2012-08-26 DIAGNOSIS — G4700 Insomnia, unspecified: Secondary | ICD-10-CM

## 2012-08-26 LAB — POCT URINALYSIS DIPSTICK
Bilirubin, UA: NEGATIVE
Blood, UA: NEGATIVE
Glucose, UA: NEGATIVE
Ketones, UA: NEGATIVE
Leukocytes, UA: NEGATIVE
Nitrite, UA: NEGATIVE
Protein, UA: NEGATIVE
Urobilinogen, UA: NEGATIVE
pH, UA: 5

## 2012-08-26 MED ORDER — ZALEPLON 10 MG PO CAPS: 10.0000 mg | ORAL_CAPSULE | Freq: Every day | ORAL | Status: DC

## 2012-08-26 NOTE — Progress Notes (Signed)
62 y.o.  Married  caucasian female   G0P0000 here for annual exam. Pt is s/p TAH-BSO  For stage 1B, grade 1 cancer, she is no longer seeing oncology since 2012 and is getting annual cuff smears.  All normal. She does not report hot flashes, does not have night sweats, does have vaginal dryness.  She is using lubricants, astroglide.  No LMP recorded. Patient has had a hysterectomy.          Sexually active: yes  The current method of family planning is status post hysterectomy.    Exercising: no  Last pap: 6/13 Abnormal PAP: years ago Mammogram: 12/13 BiRADS 1 BSE: imtermittent Colonoscopy:  DEXA: 11/09 Alcohol: social  Tobacco: no  Health Maintenance  Topic Date Due  . Tetanus/tdap  07/25/1967  . Mammogram  07/25/1998  . Colonoscopy  07/25/1998  . Zostavax  07/24/2008  . Influenza Vaccine  11/16/2012  . Pap Smear  08/20/2014    Family History  Problem Relation Age of Onset  . Diabetes Mother   . Hypertension Mother   . Stroke Mother   . Hypertension Father   . Hypertension Sister   . Thyroid disease Sister     Patient Active Problem List   Diagnosis Date Noted  . ASTHMA 04/07/2007  . ALLERGIC RHINITIS 04/06/2007    Past Medical History  Diagnosis Date  . Hypertension   . Fibroid 1998    fundal 1.5 cm  . PID (pelvic inflammatory disease) 1986    laparoscopy  . ASCUS on Pap smear 1994    x2 - colpo  . Diverticulosis     citrucel  . Cancer 2009    endometrial adenocarcinoma  . Osteoarthritis of hand     --right    Past Surgical History  Procedure Laterality Date  . Blepharoplasty  09/1998  . Abdominal hysterectomy  05/26/07    TAH/BSO- Grade 1 endometrial ad  . Carpal tunnel release Right 2004    Dr. Mina Marble  . Plantar fascia release Right     Allergies: Review of patient's allergies indicates no known allergies.  Current Outpatient Prescriptions  Medication Sig Dispense Refill  . Budesonide (PULMICORT IN) Inhale into the lungs.      .  hydrochlorothiazide (MICROZIDE) 12.5 MG capsule Take 12.5 mg by mouth daily.      . Loratadine (CLARITIN PO) Take by mouth.      . Omega-3 Fatty Acids (FISH OIL PO) Take by mouth.      . Pseudoephedrine-Guaifenesin (MUCINEX D PO) Take by mouth.      Marland Kitchen RAMIPRIL PO Take by mouth.      . VOLTAREN 1 % GEL Apply 1 application topically 2 (two) times daily.      . zaleplon (SONATA) 10 MG capsule Take 10 mg by mouth at bedtime.       No current facility-administered medications for this visit.    ROS: Pertinent items are noted in HPI.  Exam:    BP 138/76  Ht 5' 4.5" (1.638 m)  Wt 182 lb (82.555 kg)  BMI 30.77 kg/m2 Weight change: @WEIGHTCHANGE @ Last 3 height recordings:  Ht Readings from Last 3 Encounters:  08/26/12 5' 4.5" (1.638 m)   General appearance: alert, cooperative and appears stated age Head: Normocephalic, without obvious abnormality, atraumatic Neck: no adenopathy, no carotid bruit, no JVD, supple, symmetrical, trachea midline and thyroid not enlarged, symmetric, no tenderness/mass/nodules Lungs: clear to auscultation bilaterally Breasts: normal appearance, no masses or tenderness Heart: regular rate and rhythm,  S1, S2 normal, no murmur, click, rub or gallop Abdomen: soft, non-tender; bowel sounds normal; no masses,  no organomegaly Extremities: extremities normal, atraumatic, no cyanosis or edema Skin: Skin color, texture, turgor normal. No rashes or lesions Lymph nodes: Cervical, supraclavicular, and axillary nodes normal. no inguinal nodes palpated Neurologic: Grossly normal   Pelvic: External genitalia:  no lesions              Urethra: normal appearing urethra with no masses, tenderness or lesions              Bartholins and Skenes: Bartholin's, Urethra, Skene's normal                 Vagina: atrophic              Cervix: absent              Pap taken: yes        Bimanual Exam:  Uterus:  surgically absent, vaginal cuff well healed                                       Adnexa:    surgically absent bilateral                                      Rectovaginal: Confirms                                      Anus:  normal sphincter tone, no lesions  A: well woman History of endometrial cancer 1B, annual PAP     P: mammogram annual  pap smear qyrear counseled on breast self exam, mammography screening discusssed at length family history of breast and ovarian cancer in maternal aunts-2, her mother had a hysterectomy at 5 with BSO.  Discussed risk of familial cancer, discussed BRCA testing and its affects on how she would manage a breast &/or colon cancer risk.  Pt does not have children, made aware if she is positive, may affect her nieces and nephews.  She was given handouts on BRCA testing and will rto if she decides to have drawn. return annually or prn Discussed PAP guideline changes, importance of weight bearing exercises, calcium, vit D and balanced diet. Length of time discussing hereditary cancer risks and prevention 32m  An After Visit Summary was printed and given to the patient.

## 2012-08-26 NOTE — Progress Notes (Deleted)
Patient ID: Valerie Wilson, female   DOB: 03/08/1951, 62 y.o.   MRN: [id number] .

## 2012-08-27 ENCOUNTER — Ambulatory Visit: Payer: Self-pay | Admitting: Gynecology

## 2012-08-27 LAB — IPS PAP SMEAR ONLY

## 2013-04-27 ENCOUNTER — Telehealth: Payer: Self-pay | Admitting: Emergency Medicine

## 2013-04-27 NOTE — Telephone Encounter (Signed)
Message left to return call to Valerie Wilson at [phone].    

## 2013-04-27 NOTE — Telephone Encounter (Signed)
Message copied by Michele Mcalpine on Tue Apr 27, 2013  9:36 AM ------      Message from: Elveria Rising      Created: Fri Apr 16, 2013  1:30 PM       Inform pt, just got DEXA, hips are great!!  Spine good too.  Minimal bone loss as to be expected after she stopped estrogen, continue weight bearing exercise, vit d and calcium ------

## 2013-04-28 NOTE — Telephone Encounter (Signed)
Spoke with patient and message from Dr. Charlies Constable given. Will follow up prn.

## 2013-05-12 ENCOUNTER — Encounter: Payer: Self-pay | Admitting: Gynecology

## 2013-09-08 ENCOUNTER — Encounter: Payer: Self-pay | Admitting: Obstetrics and Gynecology

## 2013-09-08 ENCOUNTER — Ambulatory Visit (INDEPENDENT_AMBULATORY_CARE_PROVIDER_SITE_OTHER): Payer: Medicare Other | Admitting: Obstetrics and Gynecology

## 2013-09-08 VITALS — BP 126/78 | HR 68 | Ht 65.0 in | Wt 192.0 lb

## 2013-09-08 DIAGNOSIS — Z0000 Encounter for general adult medical examination without abnormal findings: Secondary | ICD-10-CM

## 2013-09-08 DIAGNOSIS — Z124 Encounter for screening for malignant neoplasm of cervix: Secondary | ICD-10-CM

## 2013-09-08 DIAGNOSIS — Z01419 Encounter for gynecological examination (general) (routine) without abnormal findings: Secondary | ICD-10-CM

## 2013-09-08 LAB — POCT URINALYSIS DIPSTICK
Bilirubin, UA: NEGATIVE
Glucose, UA: NEGATIVE
Ketones, UA: NEGATIVE
Leukocytes, UA: NEGATIVE
Nitrite, UA: NEGATIVE
PH UA: 5
PROTEIN UA: NEGATIVE
RBC UA: NEGATIVE
Urobilinogen, UA: NEGATIVE

## 2013-09-08 NOTE — Progress Notes (Signed)
63 y.o. Caucasian female   G0P0000 here for annual exam.  She does report hot flashes, does not have night sweats, does have vaginal dryness.  She is using lubricants.  She does not report post-menopasual bleeding  History of endometrial adenocarcinoma - Stage IB, grade I Status post TAH/BSO.   Enjoys gardening.   Labs with PCP - Dr. Redmond Pulling.   No LMP recorded. Patient has had a hysterectomy.          Sexually active: yes  The current method of family planning is tubal ligation.    Exercising: yes  Home exercise routine includes walking 1.5 hrs per week. Last pap: 6/14 Abnormal PAP: 2008 Mammogram: 04/09/2013, Benign Colonoscopy: 2011, WML DEXA: 04/09/2013, WNL  Alcohol: No Tobacco: No Labs: PCP    Urine: Normal  Tetanus 2011 or 2012.  Zostavax January 2015. Pneumococcal Vaccine 2014.   Health Maintenance  Topic Date Due  . Tetanus/tdap  07/25/1967  . Colonoscopy  07/25/1998  . Zostavax  07/24/2008  . Pneumococcal Polysaccharide Vaccine Age 69 And Over  07/24/2013  . Influenza Vaccine  10/16/2013  . Mammogram  04/10/2015    Family History  Problem Relation Age of Onset  . Diabetes Mother   . Hypertension Mother   . Stroke Mother   . Hypertension Father   . Hypertension Sister   . Thyroid disease Sister     Patient Active Problem List   Diagnosis Date Noted  . Endometrial cancer 08/26/2012  . ASTHMA 04/07/2007  . ALLERGIC RHINITIS 04/06/2007    Past Medical History  Diagnosis Date  . Hypertension   . Fibroid 1998    fundal 1.5 cm  . PID (pelvic inflammatory disease) 1986    laparoscopy  . ASCUS on Pap smear 1994    x2 - colpo  . Diverticulosis     citrucel  . Cancer 2009    endometrial adenocarcinoma  . Osteoarthritis of hand     --right    Past Surgical History  Procedure Laterality Date  . Blepharoplasty  09/1998  . Abdominal hysterectomy  05/26/07    TAH/BSO- Grade 1 endometrial ad  . Carpal tunnel release Right 2004    Dr. Burney Gauze  .  Plantar fascia release Right     Allergies: Review of patient's allergies indicates no known allergies.  Current Outpatient Prescriptions  Medication Sig Dispense Refill  . aspirin 81 MG tablet Take 81 mg by mouth daily.      . calcium citrate (CALCITRATE - DOSED IN MG ELEMENTAL CALCIUM) 950 MG tablet Take 1,000 mg by mouth daily.      . hydrochlorothiazide (MICROZIDE) 12.5 MG capsule Take 12.5 mg by mouth daily.      . Loratadine (CLARITIN PO) Take by mouth.      . Omega-3 Fatty Acids (FISH OIL PO) Take by mouth.      Marland Kitchen RAMIPRIL PO Take by mouth.      . zaleplon (SONATA) 10 MG capsule Take 1 capsule (10 mg total) by mouth at bedtime.  15 capsule  5   No current facility-administered medications for this visit.    ROS: Complete review of systems positive only for lung wheezing.   Exam:    BP 126/78  Pulse 68  Ht 5\' 5"  (1.651 m)  Wt 192 lb (87.091 kg)  BMI 31.95 kg/m2 Ht Readings from Last 3 Encounters:  09/08/13 5\' 5"  (1.651 m)  08/26/12 5' 4.5" (1.638 m)   General appearance: alert, cooperative and appears stated age  Head: Normocephalic, without obvious abnormality, atraumatic Neck: no adenopathy, no carotid bruit, no JVD, supple, symmetrical, trachea midline and thyroid not enlarged, symmetric, no tenderness/mass/nodules Lungs: clear to auscultation bilaterally Breasts: No dominant masses, no skin retractions, no nipple discharge, no axillary adenopathy.  Heart: regular rate and rhythm, S1, S2 normal, no murmur, click, rub or gallop Abdomen: soft, non-tender; bowel sounds normal; no masses,  no organomegaly Extremities: extremities normal, atraumatic, no cyanosis or edema Skin: Skin color, texture, turgor normal. No rashes or lesions Lymph nodes: Cervical, supraclavicular, and axillary nodes normal. no inguinal nodes palpated Neurologic: Grossly normal   Pelvic: External genitalia:  no lesions              Urethra: not indicated and normal appearing urethra with no  masses, tenderness or lesions              Bartholins and Skenes: normal                 Vagina: normal appearing vagina with erythema consistent with atrophy.               Cervix: removed surgically              Pap taken: yes         Bimanual Exam:  Uterus:  uterus is normal size, shape, consistency and nontender, surgically absent, vaginal cuff well healed                                      Adnexa:    normal adnexa in size, nontender and no masses                                      Rectovaginal: Confirms                                      Anus:  normal sphincter tone, no lesions  A: well woman History of adenocarcinoma of the endometrium.  Status post TAH/BSO. Atrophic vaginal changes.   P: mammogram recommended yearly.  pap smear performed.  No HR HPV performed.  counseled on breast self exam, adequate intake of calcium and vitamin D discussed vitamin E vaginal suppositories, water based lubricants, and canola oil to help with vaginal dryness. return annually or prn   An After Visit Summary was printed and given to the patient.

## 2013-09-08 NOTE — Patient Instructions (Signed)

## 2013-09-10 LAB — IPS PAP SMEAR ONLY

## 2013-11-11 ENCOUNTER — Encounter: Payer: Self-pay | Admitting: Obstetrics and Gynecology

## 2013-11-16 ENCOUNTER — Encounter: Payer: Self-pay | Admitting: Certified Nurse Midwife

## 2013-11-16 ENCOUNTER — Ambulatory Visit (INDEPENDENT_AMBULATORY_CARE_PROVIDER_SITE_OTHER): Payer: Medicare Other | Admitting: Certified Nurse Midwife

## 2013-11-16 VITALS — BP 126/70 | HR 72 | Resp 16 | Ht 65.0 in

## 2013-11-16 DIAGNOSIS — B3731 Acute candidiasis of vulva and vagina: Secondary | ICD-10-CM

## 2013-11-16 DIAGNOSIS — B373 Candidiasis of vulva and vagina: Secondary | ICD-10-CM

## 2013-11-16 DIAGNOSIS — N952 Postmenopausal atrophic vaginitis: Secondary | ICD-10-CM

## 2013-11-16 HISTORY — PX: CHOLECYSTECTOMY, LAPAROSCOPIC: SHX56

## 2013-11-16 MED ORDER — TERCONAZOLE 0.4 % VA CREA: 1.0000 | TOPICAL_CREAM | Freq: Every day | VAGINAL | Status: DC

## 2013-11-16 MED ORDER — NYSTATIN-TRIAMCINOLONE 100000-0.1 UNIT/GM-% EX CREA
1.0000 | TOPICAL_CREAM | Freq: Two times a day (BID) | CUTANEOUS | Status: DC
Start: 2013-11-16 — End: 2013-11-23

## 2013-11-16 NOTE — Patient Instructions (Signed)
Atrophic Vaginitis Atrophic vaginitis is a problem of low levels of estrogen in women. This problem can happen at any age. It is most common in women who have gone through menopause ("the change").  HOW WILL I KNOW IF I HAVE THIS PROBLEM? You may have:  Trouble with peeing (urinating), such as:  Going to the bathroom often.  A hard time holding your pee until you reach a bathroom.  Leaking pee.  Having pain when you pee.  Itching or a burning feeling.  Vaginal bleeding and spotting.  Pain during sex.  Dryness of the vagina.  A yellow, bad-smelling fluid (discharge) coming from the vagina. HOW WILL MY DOCTOR CHECK FOR THIS PROBLEM?  During your exam, your doctor will likely find the problem.  If there is a vaginal fluid, it may be checked for infection. HOW WILL THIS PROBLEM BE TREATED? Keep the vulvar skin as clean as possible. Moisturizers and lubricants can help with some of the symptoms. Estrogen replacement can help. There are 2 ways to take estrogen:  Systemic estrogen gets estrogen to your whole body. It takes many weeks or months before the symptoms get better.  You take an estrogen pill.  You use a skin patch. This is a patch that you put on your skin.  If you still have your uterus, your doctor may ask you to take a hormone. Talk to your doctor about the right medicine for you.  Estrogen cream.  This puts estrogen only at the part of your body where you apply it. The cream is put into the vagina or put on the vulvar skin. For some women, estrogen cream works faster than pills or the patch. CAN ALL WOMEN WITH THIS PROBLEM USE ESTROGEN? No. Women with certain types of cancer, liver problems, or problems with blood clots should not take estrogen. Your doctor can help you decide the best treatment for your symptoms. Document Released: 08/21/2007 Document Revised: 03/09/2013 Document Reviewed: 08/21/2007 [facility] Patient Information 2015 [place], [place]. This  information is not intended to replace advice given to you by your health care provider. Make sure you discuss any questions you have with your health care provider. Candida Infection A Candida infection (also called yeast, fungus, and Monilia infection) is an overgrowth of yeast that can occur anywhere on the body. A yeast infection commonly occurs in warm, moist body areas. Usually, the infection remains localized but can spread to become a systemic infection. A yeast infection may be a sign of a more severe disease such as diabetes, leukemia, or AIDS. A yeast infection can occur in both men and women. In women, Candida vaginitis is a vaginal infection. It is one of the most common causes of vaginitis. Men usually do not have symptoms or know they have an infection until other problems develop. Men may find out they have a yeast infection because their sex partner has a yeast infection. Uncircumcised men are more likely to get a yeast infection than circumcised men. This is because the uncircumcised glans is not exposed to air and does not remain as dry as that of a circumcised glans. Older adults may develop yeast infections around dentures. CAUSES  Women  Antibiotics.  Steroid medication taken for a long time.  Being overweight (obese).  Diabetes.  Poor immune condition.  Certain serious medical conditions.  Immune suppressive medications for organ transplant patients.  Chemotherapy.  Pregnancy.  Menstruation.  Stress and fatigue.  Intravenous drug use.  Oral contraceptives.  Wearing tight-fitting clothes in  the crotch area.  Catching it from a sex partner who has a yeast infection.  Spermicide.  Intravenous, urinary, or other catheters. Men  Catching it from a sex partner who has a yeast infection.  Having oral or anal sex with a person who has the infection.  Spermicide.  Diabetes.  Antibiotics.  Poor immune system.  Medications that suppress the immune  system.  Intravenous drug use.  Intravenous, urinary, or other catheters. SYMPTOMS  Women  Thick, white vaginal discharge.  Vaginal itching.  Redness and swelling in and around the vagina.  Irritation of the lips of the vagina and perineum.  Blisters on the vaginal lips and perineum.  Painful sexual intercourse.  Low blood sugar (hypoglycemia).  Painful urination.  Bladder infections.  Intestinal problems such as constipation, indigestion, bad breath, bloating, increase in gas, diarrhea, or loose stools. Men  Men may develop intestinal problems such as constipation, indigestion, bad breath, bloating, increase in gas, diarrhea, or loose stools.  Dry, cracked skin on the penis with itching or discomfort.  Jock itch.  Dry, flaky skin.  Athlete's foot.  Hypoglycemia. DIAGNOSIS  Women  A history and an exam are performed.  The discharge may be examined under a microscope.  A culture may be taken of the discharge. Men  A history and an exam are performed.  Any discharge from the penis or areas of cracked skin will be looked at under the microscope and cultured.  Stool samples may be cultured. TREATMENT  Women  Vaginal antifungal suppositories and creams.  Medicated creams to decrease irritation and itching on the outside of the vagina.  Warm compresses to the perineal area to decrease swelling and discomfort.  Oral antifungal medications.  Medicated vaginal suppositories or cream for repeated or recurrent infections.  Wash and dry the irritation areas before applying the cream.  Eating yogurt with Lactobacillus may help with prevention and treatment.  Sometimes painting the vagina with gentian violet solution may help if creams and suppositories do not work. Men  Antifungal creams and oral antifungal medications.  Sometimes treatment must continue for 30 days after the symptoms go away to prevent recurrence. HOME CARE INSTRUCTIONS  Women  Use  cotton underwear and avoid tight-fitting clothing.  Avoid colored, scented toilet paper and deodorant tampons or pads.  Do not douche.  Keep your diabetes under control.  Finish all the prescribed medications.  Keep your skin clean and dry.  Consume milk or yogurt with Lactobacillus-active culture regularly. If you get frequent yeast infections and think that is what the infection is, there are over-the-counter medications that you can get. If the infection does not show healing in 3 days, talk to your caregiver.  Tell your sex partner you have a yeast infection. Your partner may need treatment also, especially if your infection does not clear up or recurs. Men  Keep your skin clean and dry.  Keep your diabetes under control.  Finish all prescribed medications.  Tell your sex partner that you have a yeast infection so he or she can be treated if necessary. SEEK MEDICAL CARE IF:   Your symptoms do not clear up or worsen in one week after treatment.  You have an oral temperature above 102 F (38.9 C).  You have trouble swallowing or eating for a prolonged time.  You develop blisters on and around your vagina.  You develop vaginal bleeding and it is not your menstrual period.  You develop abdominal pain.  You develop intestinal problems as  mentioned above.  You get weak or light-headed.  You have painful or increased urination.  You have pain during sexual intercourse. MAKE SURE YOU:   Understand these instructions.  Will watch your condition.  Will get help right away if you are not doing well or get worse. Document Released: 04/11/2004 Document Revised: 07/19/2013 Document Reviewed: 07/24/2009 [facility] Patient Information 2015 [place], [place]. This information is not intended to replace advice given to you by your health care provider. Make sure you discuss any questions you have with your health care provider.

## 2013-11-16 NOTE — Progress Notes (Signed)
63 y.o.married white female g0p0 here with complaint of vaginal symptoms of itching, burning and vaginal dryness.Marland Kitchen Describes discharge as none. Onset of symptoms 21 days ago. Denies new personal products. No STD concerns. Urinary symptoms none .   O:Healthy female WDWN Affect: normal, orientation x 3  Exam: Abdomen: Lymph node: no enlargement or tenderness Pelvic exam: External genital: red with scaling and cracking of the skin with exudate,  No lesions noted wet prep taken BUS: negative Vagina: white scant discharge noted. Ph:4.5   ,Wet prep taken Cervix: absent Uterus: absent Adnexa:absent Perineal area: red, excoriated appearance which continues down to rectal area, wet prep taken  Wet Prep results: positive yeast in vagina and on skin   A:Yeast vaginitis/vulvitis/dermatitis Atrophic vaginitis   P:Discussed findings of yeast infection and etiology. Discussed Aveeno or baking soda sitz bath for comfort. Avoid moist clothes or pads for extended period of time. Change underwear frequently when moist. Rx: Terazol 7 see order Rx Mycolog cream see order Discussed etiology of will be better able to assess at recheck due to change with yeast infection.  Rv 2 weeks, prn  Rv prn

## 2013-11-17 NOTE — Progress Notes (Signed)
Reviewed personally.  M. Suzanne Jayde Daffin, MD.  

## 2013-11-23 ENCOUNTER — Other Ambulatory Visit (INDEPENDENT_AMBULATORY_CARE_PROVIDER_SITE_OTHER): Payer: Medicare Other

## 2013-11-23 ENCOUNTER — Encounter: Payer: Self-pay | Admitting: *Deleted

## 2013-11-23 ENCOUNTER — Ambulatory Visit (HOSPITAL_COMMUNITY)
Admission: RE | Admit: 2013-11-23 | Discharge: 2013-11-23 | Disposition: A | Discharge status: Home / Self Care | Payer: Medicare Other | Source: Ambulatory Visit | Attending: Internal Medicine | Admitting: Internal Medicine

## 2013-11-23 ENCOUNTER — Ambulatory Visit (INDEPENDENT_AMBULATORY_CARE_PROVIDER_SITE_OTHER): Payer: Medicare Other | Admitting: Internal Medicine

## 2013-11-23 ENCOUNTER — Telehealth: Payer: Self-pay | Admitting: Internal Medicine

## 2013-11-23 ENCOUNTER — Telehealth: Payer: Self-pay | Admitting: *Deleted

## 2013-11-23 VITALS — BP 130/76 | HR 72 | Ht 65.0 in | Wt 185.6 lb

## 2013-11-23 DIAGNOSIS — R1013 Epigastric pain: Secondary | ICD-10-CM

## 2013-11-23 DIAGNOSIS — K8020 Calculus of gallbladder without cholecystitis without obstruction: Secondary | ICD-10-CM | POA: Insufficient documentation

## 2013-11-23 DIAGNOSIS — K7689 Other specified diseases of liver: Secondary | ICD-10-CM | POA: Insufficient documentation

## 2013-11-23 DIAGNOSIS — R142 Eructation: Secondary | ICD-10-CM

## 2013-11-23 DIAGNOSIS — R143 Flatulence: Secondary | ICD-10-CM

## 2013-11-23 DIAGNOSIS — R141 Gas pain: Secondary | ICD-10-CM | POA: Insufficient documentation

## 2013-11-23 LAB — AMYLASE: Amylase: 85 U/L (ref 27–131)

## 2013-11-23 LAB — HEPATIC FUNCTION PANEL
ALBUMIN: 4 g/dL (ref 3.5–5.2)
ALT: 65 U/L — ABNORMAL HIGH (ref 0–35)
AST: 21 U/L (ref 0–37)
Alkaline Phosphatase: 98 U/L (ref 39–117)
BILIRUBIN DIRECT: 0.1 mg/dL (ref 0.0–0.3)
Total Bilirubin: 1 mg/dL (ref 0.2–1.2)
Total Protein: 7.8 g/dL (ref 6.0–8.3)

## 2013-11-23 LAB — CBC WITH DIFFERENTIAL/PLATELET
Basophils Absolute: 0 10*3/uL (ref 0.0–0.1)
Basophils Relative: 0.4 % (ref 0.0–3.0)
EOS PCT: 5 % (ref 0.0–5.0)
Eosinophils Absolute: 0.7 10*3/uL (ref 0.0–0.7)
HEMATOCRIT: 39.1 % (ref 36.0–46.0)
Hemoglobin: 13.4 g/dL (ref 12.0–15.0)
LYMPHS ABS: 3 10*3/uL (ref 0.7–4.0)
Lymphocytes Relative: 21.9 % (ref 12.0–46.0)
MCHC: 34.2 g/dL (ref 30.0–36.0)
MCV: 88.4 fl (ref 78.0–100.0)
MONO ABS: 1.1 10*3/uL — AB (ref 0.1–1.0)
MONOS PCT: 8.2 % (ref 3.0–12.0)
Neutro Abs: 8.8 10*3/uL — ABNORMAL HIGH (ref 1.4–7.7)
Neutrophils Relative %: 64.5 % (ref 43.0–77.0)
Platelets: 257 10*3/uL (ref 150.0–400.0)
RBC: 4.42 Mil/uL (ref 3.87–5.11)
RDW: 13.4 % (ref 11.5–15.5)
WBC: 13.7 10*3/uL — AB (ref 4.0–10.5)

## 2013-11-23 LAB — LIPASE: Lipase: 36 U/L (ref 11.0–59.0)

## 2013-11-23 MED ORDER — CIPROFLOXACIN HCL 500 MG PO TABS: 500.0000 mg | ORAL_TABLET | Freq: Two times a day (BID) | ORAL | Status: DC

## 2013-11-23 MED ORDER — TRAMADOL HCL 50 MG PO TABS: ORAL_TABLET | ORAL | Status: DC

## 2013-11-23 NOTE — Telephone Encounter (Signed)
Patient calling to report this weekend, she went to Urgent Care for vomiting, fever and abdominal pain from breast to belly button. Pain at that time was a "10" and today it is a "5". She reports that she has had these episodes randomly for about a year. She did see her cardiologist and r/o cardiac problems earlier this year. This is the worse episode she has had. She was told to f/u with her GI this week. Scheduled today at 10:45 AM.

## 2013-11-23 NOTE — Telephone Encounter (Signed)
Patient went to [facility] on [address]. Spoke with Crystal and will need ROI faxed to [phone] and then they will send records.

## 2013-11-23 NOTE — Patient Instructions (Addendum)
You have been scheduled for an abdominal ultrasound at [facility] (1st floor of hospital) on TODAY at 1:30 pm. Please arrive 15 minutes prior to your appointment for registration. Make certain not to have anything to eat or drink 6 hours prior to your appointment. Should you need to reschedule your appointment, please contact radiology at [phone]. This test typically takes about 30 minutes to perform.  You have been scheduled for an endoscopy. Please follow written instructions given to you at your visit today. If you use inhalers (even only as needed), please bring them with you on the day of your procedure. Your physician has requested that you go to www.startemmi.com and enter the access code given to you at your visit today. This web site gives a general overview about your procedure. However, you should still follow specific instructions given to you by our office regarding your preparation for the procedure.  Your physician has requested that you go to the basement for the following lab work before leaving today: CBC, Amylase, Lipase, Hepatic Function  We have sent the following medications to your pharmacy for you to pick up at your convenience: Cipro 500 mg twice daily Tramadol 50 mg every 4-6 hours as needed for pain  Dr Kathryne Eriksson

## 2013-11-23 NOTE — Progress Notes (Signed)
KAHLANI Wilson October 10, 1950 [id number]  Note: This dictation was prepared with Dragon digital system. Any transcriptional errors that result from this procedure are unintentional.   History of Present Illness:  This is a 63 year old white female with acute onset of epigastric pain within several hours of eating a large sandwich. The pain was on a scale of 9 out of 10.. She vomited once and had 1 loose bowel movement. There was no fever. The pain was described as steady , not crampy. It did not radiate to her back and it persisted for several hours through the night and is still present today, localized to the epigastrium and left upper quadrant. It is 5/10 today. She has stayed on clear liquids. She has been having similar but much milder attacks this past year usually after large meal. Attacks last only about 10-15 minutes. She denies taking any anti-inflammatory agents. She has a strong family history of gallbladder disease and 4 paternal cousins who all had cholecystectomies. She has never had a gallbladder ultrasound. She has a history of grade 1B endometrial carcinoma in 2009. She underwent TAH and BSO by Dr. Laurance Flatten. There was no radiation or chemotherapy. We did a screening colonoscopy in November 2000 and again in January 2011 which showed moderately severe diverticulosis of the sigmoid colon. 2 days ago, she was seen at [facility] on [address]. and was assessed by Lynnae Prude, PA. She had blood drawn for CBC, liver tests, amylase and lipase which were normal and was given promethazine 12.5 mg and Protonix 40 mg..      Past Medical History  Diagnosis Date  . Hypertension   . Fibroid 1998    fundal 1.5 cm  . PID (pelvic inflammatory disease) 1986    laparoscopy  . ASCUS on Pap smear 1994    x2 - colpo  . Diverticulosis     citrucel  . Cancer 2009    endometrial adenocarcinoma  . Osteoarthritis of hand     --right  . Diverticulosis     Past Surgical History  Procedure Laterality  Date  . Blepharoplasty  09/1998  . Abdominal hysterectomy  05/26/07    TAH/BSO- Grade 1 endometrial ad  . Carpal tunnel release Right 2004    Dr. Burney Gauze  . Plantar fascia release Right     No Known Allergies  Family history and social history have been reviewed.  Review of Systems: Denies heartburn. No stools since 2 days ago   The remainder of the 10 point ROS is negative except as outlined in the H&P  Physical Exam: General Appearance Well developed, in no distress Eyes  Non icteric  HEENT  Non traumatic, normocephalic  Mouth No lesion, tongue papillated, no cheilosis Neck Supple without adenopathy, thyroid not enlarged, no carotid bruits, no JVD Lungs Clear to auscultation bilaterally COR Normal S1, normal S2, regular rhythm, no murmur, quiet precordium Abdomen  protuberant soft but diffusely tender more so in the left upper quadrant and epigastrium extending into the right upper quadrant. Liver not tender. No palpable mass or ascites, abdomen is slightly distended but bowel sounds are normoactive. Lower abdomen is unremarkable  Rectal  soft Hemoccult negative stool  Extremities  No pedal edema Skin No lesions Neurological Alert and oriented x 3 Psychological Normal mood and affect  Assessment and Plan:   Problem #45 63 year old white female with acute abdominal pain and similar but less severe pains for the past year. She has a strong family history of gallbladder disease. Her  symptoms are suggestive of biliary colic. Left upper quadrant tenderness and pain may be suggestive of low-grade pancreatitis. Peptic ulcer disease needs to be ruled out as does small bowel disease such as intussusception or infarcted bowel. Another possibility would be metastatic disease into the abdomen , hx of endometrial cancer. We will proceed with an upper abdominal ultrasound this afternoon. We will repeat labs which will include amylase, lipase, hepatic function and CBC again. She will be started on  Cipro 500 mg twice a day for presumed cholecystitis and will be tentatively scheduled for an upper endoscopy for tomorrow. She will also be given tramadol 50 mg, dispense # 20, one by mouth every 4-6 hours when necessary for pain.     Delfin Edis 11/23/2013

## 2013-11-23 NOTE — Telephone Encounter (Signed)
Left a message for patient to call me. 

## 2013-11-24 ENCOUNTER — Other Ambulatory Visit: Payer: Self-pay | Admitting: *Deleted

## 2013-11-24 ENCOUNTER — Encounter: Payer: Self-pay | Admitting: Internal Medicine

## 2013-11-24 ENCOUNTER — Ambulatory Visit (AMBULATORY_SURGERY_CENTER): Payer: Medicare Other | Admitting: Internal Medicine

## 2013-11-24 ENCOUNTER — Telehealth: Payer: Self-pay | Admitting: *Deleted

## 2013-11-24 VITALS — BP 114/72 | HR 58 | Temp 96.3°F | Resp 18 | Ht 65.0 in | Wt 185.0 lb

## 2013-11-24 DIAGNOSIS — K8020 Calculus of gallbladder without cholecystitis without obstruction: Secondary | ICD-10-CM

## 2013-11-24 DIAGNOSIS — K2990 Gastroduodenitis, unspecified, without bleeding: Secondary | ICD-10-CM

## 2013-11-24 DIAGNOSIS — K2960 Other gastritis without bleeding: Secondary | ICD-10-CM

## 2013-11-24 DIAGNOSIS — R1013 Epigastric pain: Secondary | ICD-10-CM

## 2013-11-24 DIAGNOSIS — K2970 Gastritis, unspecified, without bleeding: Secondary | ICD-10-CM

## 2013-11-24 MED ORDER — SODIUM CHLORIDE 0.9 % IV SOLN: 500.0000 mL | INTRAVENOUS | Status: DC

## 2013-11-24 NOTE — Telephone Encounter (Signed)
Pt has been scheduled with Dr. Rosendo Gros 12/15/13 at 1:30pm and pt is aware. You will not see the appt we are on a new system.     Note from [place] with [facility].

## 2013-11-24 NOTE — Progress Notes (Signed)
A/ox3, pleased with MAC, report to RN 

## 2013-11-24 NOTE — Progress Notes (Signed)
Called to room to assist during endoscopic procedure.  Patient ID and intended procedure confirmed with present staff. Received instructions for my participation in the procedure from the performing physician.  

## 2013-11-24 NOTE — Patient Instructions (Signed)
YOU HAD AN ENDOSCOPIC PROCEDURE TODAY AT THE [facility]: Refer to the procedure report that was given to you for any specific questions about what was found during the examination.  If the procedure report does not answer your questions, please call your gastroenterologist to clarify.  If you requested that your care partner not be given the details of your procedure findings, then the procedure report has been included in a sealed envelope for you to review at your convenience later.  YOU SHOULD EXPECT: Some feelings of bloating in the abdomen. Passage of more gas than usual.  Walking can help get rid of the air that was put into your GI tract during the procedure and reduce the bloating. If you had a lower endoscopy (such as a colonoscopy or flexible sigmoidoscopy) you may notice spotting of blood in your stool or on the toilet paper. If you underwent a bowel prep for your procedure, then you may not have a normal bowel movement for a few days.  DIET: Your first meal following the procedure should be a light meal and then it is ok to progress to your normal diet.  A half-sandwich or bowl of soup is an example of a good first meal.  Heavy or fried foods are harder to digest and may make you feel nauseous or bloated.  Likewise meals heavy in dairy and vegetables can cause extra gas to form and this can also increase the bloating.  Drink plenty of fluids but you should avoid alcoholic beverages for 24 hours.  ACTIVITY: Your care partner should take you home directly after the procedure.  You should plan to take it easy, moving slowly for the rest of the day.  You can resume normal activity the day after the procedure however you should NOT DRIVE or use heavy machinery for 24 hours (because of the sedation medicines used during the test).    SYMPTOMS TO REPORT IMMEDIATELY: A gastroenterologist can be reached at any hour.  During normal business hours, 8:30 AM to 5:00 PM Monday through Friday,  call [phone].  After hours and on weekends, please call the GI answering service at [phone] who will take a message and have the physician on call contact you.    Following upper endoscopy (EGD)  Vomiting of blood or coffee ground material  New chest pain or pain under the shoulder blades  Painful or persistently difficult swallowing  New shortness of breath  Fever of 100F or higher  Black, tarry-looking stools  FOLLOW UP: If any biopsies were taken you will be contacted by phone or by letter within the next 1-3 weeks.  Call your gastroenterologist if you have not heard about the biopsies in 3 weeks.  Our staff will call the home number listed on your records the next business day following your procedure to check on you and address any questions or concerns that you may have at that time regarding the information given to you following your procedure. This is a courtesy call and so if there is no answer at the home number and we have not heard from you through the emergency physician on call, we will assume that you have returned to your regular daily activities without incident.  SIGNATURES/CONFIDENTIALITY: You and/or your care partner have signed paperwork which will be entered into your electronic medical record.  These signatures attest to the fact that that the information above on your After Visit Summary has been reviewed and is understood.  Full   responsibility of the confidentiality of this discharge information lies with you and/or your care-partner.  Recommendations Await pathology results Continue Protonix 40 mg daily. Continue Tramadol as needed for pain.  Surgical referral for consideration of Cholecystectomy.

## 2013-11-24 NOTE — Telephone Encounter (Signed)
Left a message for new patient coordinator to call for appointment for patient.(evaluate for lap. Cholestectomy) .

## 2013-11-24 NOTE — Op Note (Signed)
[place]  [address]. [place], [address]   ENDOSCOPY PROCEDURE REPORT  PATIENT: Valerie, Wilson  MR#: [id number] BIRTHDATE: [birth year] , 63  yrs. old GENDER: Female ENDOSCOPIST: Lafayette Dragon, MD REFERRED BY:  Dr Kathryne Eriksson PROCEDURE DATE:  11/24/2013 PROCEDURE:  EGD w/ biopsy ASA CLASS:     Class II INDICATIONS:  Epigastric pain.   acute epigastric pain. Cholelithiasis on ultrasound.  Mild elevation of LFTs.Marland Kitchen MEDICATIONS: MAC sedation, administered by CRNA and propofol (Diprivan) 200mg  IV TOPICAL ANESTHETIC: none  DESCRIPTION OF PROCEDURE: After the risks benefits and alternatives of the procedure were thoroughly explained, informed consent was obtained.  The LB [id number] endoscope was introduced through the mouth and advanced to the second portion of the duodenum. Without limitations.  The instrument was slowly withdrawn as the mucosa was fully examined.      Esophagus:[ proximal mid and distal esophageal mucosa was normal, there was no evidence of esophagitis. Squamocolumnar junction was normal  Stomach: the gastric folds were mildly erythematous. There was a 1 enlarged fold in  prepyloric antrum which had a small erosion on the surface. Biopsies were obtained to rule out H. pylori. Gastric outlet was normal. Retroflexion of the over the normal fundus and cardia  Duodenum: duodenal bulb and descending duodenum was normal The scope was then withdrawn from the patient and the procedure completed.  COMPLICATIONS: There were no complications. ENDOSCOPIC IMPRESSION:   essentially normal upper endoscopy of the esophagus stomach and the duodenum minimal prepyloric gastritis. Status post biopsies to rule out H. pylori I believe  patient's symptoms are related to symptomatic gallbladder disease. We will make a referral for surgical consultation RECOMMENDATIONS: 1.  Await pathology results 2.  low-fat diet Continue Protonix 40 mg  daily Surgical referral for consideration of cholecystectomy Continue tramadol when necessary pain  REPEAT EXAM: for EGD pending biopsy results.  eSigned:  Lafayette Dragon, MD 11/24/2013 9:04 AM   CC:  PATIENT NAME:  Valerie, Wilson MR#: [id number]

## 2013-11-25 ENCOUNTER — Telehealth: Payer: Self-pay | Admitting: Internal Medicine

## 2013-11-25 ENCOUNTER — Telehealth: Payer: Self-pay

## 2013-11-25 NOTE — Telephone Encounter (Signed)
Spoke with patient and she wants to be referred to Dr. Bailey Mech in [facility] for surgical consult because she can be seen earlier than at [place]. She has a trip planned in Oct to [place] and wants to have everything taken care of as soon as possible. Need to call [phone] option 2. They will need records faxed.Spoke with Seth Bake at [facility] and scheduled patient with Dr. Carlis Abbott on 12/06/13 at 8:30 AM. Faxed records to [phone]. Cancelled appointment at [place] as per patient.

## 2013-11-25 NOTE — Telephone Encounter (Signed)
Spoke with patient and told her [facility] will not see gallstones/gallbladder patient's urgently. She is going to call them to see how long after OV they anticipate she would have surgical procedure.

## 2013-11-25 NOTE — Telephone Encounter (Signed)
  Follow up Call-  Call back number 11/24/2013  Post procedure Call Back phone  # [phone]  Permission to leave phone message Yes     Patient questions:  Do you have a fever, pain , or abdominal swelling? No. Pain Score  0 *  Have you tolerated food without any problems? Yes.    Have you been able to return to your normal activities? Yes.    Do you have any questions about your discharge instructions: Diet   No. Medications  No. Follow up visit  No.  Do you have questions or concerns about your Care? No.  Actions: * If pain score is 4 or above: No action needed, pain <4.

## 2013-11-30 ENCOUNTER — Ambulatory Visit (INDEPENDENT_AMBULATORY_CARE_PROVIDER_SITE_OTHER): Payer: Medicare Other | Admitting: Certified Nurse Midwife

## 2013-11-30 ENCOUNTER — Encounter: Payer: Self-pay | Admitting: Certified Nurse Midwife

## 2013-11-30 ENCOUNTER — Encounter: Payer: Self-pay | Admitting: Internal Medicine

## 2013-11-30 VITALS — BP 120/70 | HR 68 | Resp 16 | Ht 65.0 in | Wt 183.0 lb

## 2013-11-30 DIAGNOSIS — K859 Acute pancreatitis without necrosis or infection, unspecified: Secondary | ICD-10-CM | POA: Insufficient documentation

## 2013-11-30 DIAGNOSIS — B373 Candidiasis of vulva and vagina: Secondary | ICD-10-CM

## 2013-11-30 DIAGNOSIS — B3731 Acute candidiasis of vulva and vagina: Secondary | ICD-10-CM

## 2013-11-30 MED ORDER — TERCONAZOLE 0.4 % VA CREA: 1.0000 | TOPICAL_CREAM | Freq: Every day | VAGINAL | Status: DC

## 2013-11-30 NOTE — Progress Notes (Signed)
63 y.o. married white female g0p0 here with complaint of vaginal symptoms of itching, burning, and increase discharge. Describes discharge as white and thin. Onset of symptoms 3 days ago. Denies new personal products. Patient being treated for Pancreatitis with Cipro. Patient was referred to Dr. Olevia Perches after her last visit here 11/16/13 with felt to be gastric, no pelvic pain issues and was diagnosed with pancreatitis. Was also treated for yeast vaginitis at that time. She also was noted to have gallstones and since had an attack with them. Surgery scheduled when infection clears. Patient has not been sexually active since last visit so has not used any vaginal moisture. Patient has 6 more days of Cipro to complete. Denies urinary symptoms. Patient feels yeast has returned with antibiotic use. No other health issues today.   O:Healthy female WDWN Affect: normal, orientation x 3  Exam: Abdomen: soft, non tender Lymph node: no enlargement or tenderness Pelvic exam: External genital: slightly red with scaling, non tender, no lesions wet prep taken BUS: negative Vagina: scant white creamy discharge noted. Ph:4.0   ,Wet prep taken Cervix: absent Uterus: absent Adnexa:absent, no masses or fullness noted   Wet Prep results:Positive for yeast   A:Yeast vaginitis/vulvitis probably antibiotic induced Atrophic vaginitis   P:Discussed findings of yeast infection and etiology. Discussed reoccurrence probably antibiotic induced and change in vaginal flora. Discussed Aveeno or baking soda sitz bath for comfort again. Patient still has Mycolog cream from last visit and will restart bid x 7 days. Encouraged to start on oral probiotic to help with maintaining normal GI flora until Cipro completed. Rx Terazol 7 see order. Wished speedy recovery with current illness and upcoming cholecystectomy.  Rv prn

## 2013-11-30 NOTE — Patient Instructions (Signed)

## 2013-12-01 ENCOUNTER — Telehealth: Payer: Self-pay | Admitting: Internal Medicine

## 2013-12-01 ENCOUNTER — Encounter: Payer: Self-pay | Admitting: *Deleted

## 2013-12-02 NOTE — Telephone Encounter (Signed)
Faxed reports requested. Left a message for Verdis Frederickson that reports were faxed.

## 2013-12-06 NOTE — Progress Notes (Signed)
Reviewed personally.  M. Suzanne Ahnesti Townsend, MD.  

## 2013-12-14 ENCOUNTER — Other Ambulatory Visit: Payer: Self-pay | Admitting: Gynecology

## 2013-12-15 NOTE — Telephone Encounter (Signed)
Last refilled/Last AEX: 08/26/12 #15/5 refills by Dr. Jeannene Patella Scheduled: 09/14/14 with Dr. Quincy Simmonds  Please Advise.

## 2014-09-02 ENCOUNTER — Encounter: Payer: Self-pay | Admitting: Internal Medicine

## 2014-09-12 ENCOUNTER — Other Ambulatory Visit: Payer: Self-pay

## 2014-09-14 ENCOUNTER — Ambulatory Visit (INDEPENDENT_AMBULATORY_CARE_PROVIDER_SITE_OTHER): Payer: Medicare Other | Admitting: Obstetrics and Gynecology

## 2014-09-14 ENCOUNTER — Encounter: Payer: Self-pay | Admitting: Obstetrics and Gynecology

## 2014-09-14 ENCOUNTER — Ambulatory Visit: Payer: Medicare Other | Admitting: Obstetrics and Gynecology

## 2014-09-14 VITALS — BP 120/76 | HR 70 | Resp 14 | Ht 64.0 in | Wt 195.8 lb

## 2014-09-14 DIAGNOSIS — N760 Acute vaginitis: Secondary | ICD-10-CM | POA: Diagnosis not present

## 2014-09-14 DIAGNOSIS — Z01419 Encounter for gynecological examination (general) (routine) without abnormal findings: Secondary | ICD-10-CM | POA: Diagnosis not present

## 2014-09-14 DIAGNOSIS — Z124 Encounter for screening for malignant neoplasm of cervix: Secondary | ICD-10-CM

## 2014-09-14 NOTE — Progress Notes (Signed)
Patient ID: Valerie Wilson, female   DOB: [birth year], 64 y.o.   MRN: [id number] 64 y.o. [name] Married Caucasian female here for annual exam.   Patient complaint of vaginal itching/irritation and thinks she probably has a yeast infection.  History of TAH/BSO for endometrial cancer - 2009.  Having symptoms of yeast infection.  Wants evaluation for this. Feels itching externally.  No discharge.  No odor.  Probiotics no help. Treated with Terazol for yeast infection in September.  Feminine wipes and pads irritate her bottom.  Using [employer].   Having elevated blood sugars so it testing this on her own.   Leaks with cough, laugh, sneeze.  Declines evaluation or treatment.   Had laparoscopic cholecyst[facility] in Fall 2015.  Went to [place].   PCP:  Valerie Eriksson, MD  Patient's last menstrual period was 05/26/2007.          Sexually active: Yes.  female  The current method of family planning is tubal ligation/TAH/BSO.   Exercising: Yes.    housework, gardening and yard work. Smoker:  no  Health Maintenance: Pap:  09-08-13 wnl:neg HR HPV History of abnormal Pap:  Yes, Hx of Ascus pap 1994 and colposcopy x2 but no treatment to cervix. MMG:  05-25-14 Density Cat.C/benign calcifications both breasts:[facility] Colonoscopy:  03/2009 wnl Dr. Olevia Wilson.  Next due 03/2019. BMD:   04-09-13  Result  wnl TDaP:  2011 Screening Labs:  Hb today: PCP, Urine today: PCP   reports that she has never smoked. She has never used smokeless tobacco. She reports that she drinks about 3.6 oz of alcohol per week. She reports that she does not use illicit drugs.  Past Medical History  Diagnosis Date  . Hypertension   . Fibroid 1998    fundal 1.5 cm  . PID (pelvic inflammatory disease) 1986    laparoscopy  . ASCUS on Pap smear 1994    x2 - colpo  . Diverticulosis     citrucel  . Cancer 2009    endometrial adenocarcinoma  . Osteoarthritis of hand     --right  . Diverticulosis   . Pancreatitis   . Gallstone      Past Surgical History  Procedure Laterality Date  . Blepharoplasty  09/1998  . Abdominal hysterectomy  05/26/07    TAH/BSO- Grade 1 endometrial ad  . Carpal tunnel release Right 2004    Dr. Burney Wilson  . Plantar fascia release Right   . Cholecystectomy, laparoscopic  11/2013    --Dr. Owens Wilson [facility]    Current Outpatient Prescriptions  Medication Sig Dispense Refill  . ALPRAZolam (XANAX) 0.5 MG tablet Take 0.5 mg by mouth 3 (three) times daily as needed for anxiety.    Marland Kitchen aspirin 81 MG tablet Take 81 mg by mouth daily.    . budesonide (PULMICORT) 180 MCG/ACT inhaler Inhale 2 puffs into the lungs as needed.    . calcium citrate (CALCITRATE - DOSED IN MG ELEMENTAL CALCIUM) 950 MG tablet Take 1,000 mg by mouth daily.    . hydrochlorothiazide (MICROZIDE) 12.5 MG capsule Take 12.5 mg by mouth daily.    . Omega-3 Fatty Acids (FISH OIL PO) Take by mouth.    . ramipril (ALTACE) 10 MG capsule Take 10 mg by mouth daily.  3   No current facility-administered medications for this visit.    Family History  Problem Relation Age of Onset  . Diabetes Mother   . Hypertension Mother   . Stroke Mother   . Hypertension  Father   . Hypertension Sister   . Thyroid disease Sister     ROS:  Pertinent items are noted in HPI.  Otherwise, a comprehensive ROS was negative.  Exam:   BP 120/76 mmHg  Pulse 70  Resp 14  Ht 5\' 4"  (1.626 m)  Wt 195 lb 12.8 oz (88.814 kg)  BMI 33.59 kg/m2  LMP 05/26/2007    General appearance: alert, cooperative and appears stated age Head: Normocephalic, without obvious abnormality, atraumatic Neck: no adenopathy, supple, symmetrical, trachea midline and thyroid normal to inspection and palpation Lungs: clear to auscultation bilaterally Breasts: normal appearance, no masses or tenderness, Inspection negative, No nipple retraction or dimpling, No nipple discharge or bleeding, No axillary or supraclavicular adenopathy Heart: regular rate and rhythm Abdomen:  vertical midline incision, soft, non-tender; bowel sounds normal; no masses,  no organomegaly Extremities: extremities normal, atraumatic, no cyanosis or edema Skin: Skin color, texture, turgor normal. No rashes or lesions Lymph nodes: Cervical, supraclavicular, and axillary nodes normal. No abnormal inguinal nodes palpated Neurologic: Grossly normal  Pelvic: External genitalia:  no lesions              Urethra:  normal appearing urethra with no masses, tenderness or lesions              Bartholins and Skenes: normal                 Vagina: normal appearing vagina with normal color and discharge, no lesions              Cervix: absent              Pap taken: Yes.   Bimanual Exam:  Uterus:  uterus absent              Adnexa: no mass, fullness, tenderness              Rectovaginal: Yes.  .  Confirms.              Anus:  normal sphincter tone, no lesions  Chaperone was present for exam.  Assessment:   Well woman visit with normal exam. Stage IB, grade 1 endometrial cancer.  Status post TAH/BSO.  Vaginitis.  Mild GSI.    Plan: Yearly mammogram recommended after age 84.  Recommended self breast exam.  Pap and HR HPV as above. Discussed Calcium, Vitamin D, regular exercise program including cardiovascular and weight bearing exercise. Labs performed.  Yes.  .   See orders.  Affirm done.  Refills given on medications.  No..   Will consider low dose local estrogen if Affirm is negative.  Discussed low risk of endometrial cancer recurrence, breast cancer, DVT, PE, MI, stroke. Follow up annually and prn.   After visit summary provided.   ___15____ minutes face to face time of which over 50% was spent in counseling regarding vaginitis and endometrial cancer history.

## 2014-09-14 NOTE — Patient Instructions (Signed)

## 2014-09-15 LAB — WET PREP BY MOLECULAR PROBE
Candida species: NEGATIVE
Gardnerella vaginalis: POSITIVE — AB
Trichomonas vaginosis: NEGATIVE

## 2014-09-16 ENCOUNTER — Other Ambulatory Visit: Payer: Self-pay | Admitting: Obstetrics and Gynecology

## 2014-09-16 LAB — IPS PAP SMEAR ONLY

## 2014-09-16 MED ORDER — METRONIDAZOLE 500 MG PO TABS: 500.0000 mg | ORAL_TABLET | Freq: Two times a day (BID) | ORAL | Status: DC

## 2015-01-14 ENCOUNTER — Emergency Department (HOSPITAL_COMMUNITY)
Admission: EM | Admit: 2015-01-14 | Discharge: 2015-01-14 | Disposition: A | Discharge status: Home / Self Care | Payer: Medicare Other | Attending: Emergency Medicine | Admitting: Emergency Medicine

## 2015-01-14 ENCOUNTER — Encounter (HOSPITAL_COMMUNITY): Payer: Self-pay | Admitting: *Deleted

## 2015-01-14 DIAGNOSIS — Z86018 Personal history of other benign neoplasm: Secondary | ICD-10-CM | POA: Diagnosis not present

## 2015-01-14 DIAGNOSIS — Y929 Unspecified place or not applicable: Secondary | ICD-10-CM | POA: Diagnosis not present

## 2015-01-14 DIAGNOSIS — Y999 Unspecified external cause status: Secondary | ICD-10-CM | POA: Diagnosis not present

## 2015-01-14 DIAGNOSIS — S61219A Laceration without foreign body of unspecified finger without damage to nail, initial encounter: Secondary | ICD-10-CM

## 2015-01-14 DIAGNOSIS — Z7982 Long term (current) use of aspirin: Secondary | ICD-10-CM | POA: Diagnosis not present

## 2015-01-14 DIAGNOSIS — Z8742 Personal history of other diseases of the female genital tract: Secondary | ICD-10-CM | POA: Insufficient documentation

## 2015-01-14 DIAGNOSIS — Z792 Long term (current) use of antibiotics: Secondary | ICD-10-CM | POA: Diagnosis not present

## 2015-01-14 DIAGNOSIS — Y9389 Activity, other specified: Secondary | ICD-10-CM | POA: Insufficient documentation

## 2015-01-14 DIAGNOSIS — S61211A Laceration without foreign body of left index finger without damage to nail, initial encounter: Secondary | ICD-10-CM | POA: Diagnosis not present

## 2015-01-14 DIAGNOSIS — W268XXA Contact with other sharp object(s), not elsewhere classified, initial encounter: Secondary | ICD-10-CM | POA: Insufficient documentation

## 2015-01-14 DIAGNOSIS — M19041 Primary osteoarthritis, right hand: Secondary | ICD-10-CM | POA: Diagnosis not present

## 2015-01-14 DIAGNOSIS — Z8589 Personal history of malignant neoplasm of other organs and systems: Secondary | ICD-10-CM | POA: Insufficient documentation

## 2015-01-14 DIAGNOSIS — Z79899 Other long term (current) drug therapy: Secondary | ICD-10-CM | POA: Diagnosis not present

## 2015-01-14 DIAGNOSIS — Z8719 Personal history of other diseases of the digestive system: Secondary | ICD-10-CM | POA: Insufficient documentation

## 2015-01-14 DIAGNOSIS — S6992XA Unspecified injury of left wrist, hand and finger(s), initial encounter: Secondary | ICD-10-CM | POA: Diagnosis present

## 2015-01-14 DIAGNOSIS — S61213A Laceration without foreign body of left middle finger without damage to nail, initial encounter: Secondary | ICD-10-CM | POA: Insufficient documentation

## 2015-01-14 DIAGNOSIS — I10 Essential (primary) hypertension: Secondary | ICD-10-CM | POA: Insufficient documentation

## 2015-01-14 NOTE — Discharge Instructions (Signed)
- Keep wound covered and clean. Use bacitracin or neosporin at home on wound. Keep wounds covered until healed - Return to ED with signs of infection including fevers, redness around the wound, pus draining from wound, inability to bend your fingers or any new concerning symptoms   Laceration Care, Adult A laceration is a cut that goes through all of the layers of the skin and into the tissue that is right under the skin. Some lacerations heal on their own. Others need to be closed with stitches (sutures), staples, skin adhesive strips, or skin glue. Proper laceration care minimizes the risk of infection and helps the laceration to heal better. HOW TO CARE FOR YOUR LACERATION If sutures or staples were used:  Keep the wound clean and dry.  If you were given a bandage (dressing), you should change it at least one time per day or as told by your health care provider. You should also change it if it becomes wet or dirty.  Keep the wound completely dry for the first 24 hours or as told by your health care provider. After that time, you may shower or bathe. However, make sure that the wound is not soaked in water until after the sutures or staples have been removed.  Clean the wound one time each day or as told by your health care provider:  Wash the wound with soap and water.  Rinse the wound with water to remove all soap.  Pat the wound dry with a clean towel. Do not rub the wound.  After cleaning the wound, apply a thin layer of antibiotic ointmentas told by your health care provider. This will help to prevent infection and keep the dressing from sticking to the wound.  Have the sutures or staples removed as told by your health care provider. If skin adhesive strips were used:  Keep the wound clean and dry.  If you were given a bandage (dressing), you should change it at least one time per day or as told by your health care provider. You should also change it if it becomes dirty or  wet.  Do not get the skin adhesive strips wet. You may shower or bathe, but be careful to keep the wound dry.  If the wound gets wet, pat it dry with a clean towel. Do not rub the wound.  Skin adhesive strips fall off on their own. You may trim the strips as the wound heals. Do not remove skin adhesive strips that are still stuck to the wound. They will fall off in time. If skin glue was used:  Try to keep the wound dry, but you may briefly wet it in the shower or bath. Do not soak the wound in water, such as by swimming.  After you have showered or bathed, gently pat the wound dry with a clean towel. Do not rub the wound.  Do not do any activities that will make you sweat heavily until the skin glue has fallen off on its own.  Do not apply liquid, cream, or ointment medicine to the wound while the skin glue is in place. Using those may loosen the film before the wound has healed.  If you were given a bandage (dressing), you should change it at least one time per day or as told by your health care provider. You should also change it if it becomes dirty or wet.  If a dressing is placed over the wound, be careful not to apply tape directly over  the skin glue. Doing that may cause the glue to be pulled off before the wound has healed.  Do not pick at the glue. The skin glue usually remains in place for 5-10 days, then it falls off of the skin. General Instructions  Take over-the-counter and prescription medicines only as told by your health care provider.  If you were prescribed an antibiotic medicine or ointment, take or apply it as told by your doctor. Do not stop using it even if your condition improves.  To help prevent scarring, make sure to cover your wound with sunscreen whenever you are outside after stitches are removed, after adhesive strips are removed, or when glue remains in place and the wound is healed. Make sure to wear a sunscreen of at least 30 SPF.  Do not scratch or  pick at the wound.  Keep all follow-up visits as told by your health care provider. This is important.  Check your wound every day for signs of infection. Watch for:  Redness, swelling, or pain.  Fluid, blood, or pus.  Raise (elevate) the injured area above the level of your heart while you are sitting or lying down, if possible. SEEK MEDICAL CARE IF:  You received a tetanus shot and you have swelling, severe pain, redness, or bleeding at the injection site.  You have a fever.  A wound that was closed breaks open.  You notice a bad smell coming from your wound or your dressing.  You notice something coming out of the wound, such as wood or glass.  Your pain is not controlled with medicine.  You have increased redness, swelling, or pain at the site of your wound.  You have fluid, blood, or pus coming from your wound.  You notice a change in the color of your skin near your wound.  You need to change the dressing frequently due to fluid, blood, or pus draining from the wound.  You develop a new rash.  You develop numbness around the wound. SEEK IMMEDIATE MEDICAL CARE IF:  You develop severe swelling around the wound.  Your pain suddenly increases and is severe.  You develop painful lumps near the wound or on skin that is anywhere on your body.  You have a red streak going away from your wound.  The wound is on your hand or foot and you cannot properly move a finger or toe.  The wound is on your hand or foot and you notice that your fingers or toes look pale or bluish.   This information is not intended to replace advice given to you by your health care provider. Make sure you discuss any questions you have with your health care provider.   Document Released: 03/04/2005 Document Revised: 07/19/2014 Document Reviewed: 02/28/2014 Elsevier Interactive Patient Education 2016 [employer]  Nonsutured Laceration Care A laceration is a cut that goes through all layers  of the skin and extends into the tissue that is right under the skin. This type of cut is usually stitched up (sutured) or closed with tape (adhesive strips) or skin glue shortly after the injury happens. However, if the wound is dirty or if several hours pass before medical treatment is provided, it is likely that germs (bacteria) will enter the wound. Closing a laceration after bacteria have entered it increases the risk of infection. In these cases, your health care provider may leave the laceration open (nonsutured) and cover it with a bandage. This type of treatment helps prevent infection and allows  the wound to heal from the deepest layer of tissue damage up to the surface. An open fracture is a type of injury that may involve nonsutured lacerations. An open fracture is a break in a bone that happens along with one or more lacerations through the skin that is near the fracture site. HOW TO CARE FOR YOUR NONSUTURED LACERATION  Take or apply over-the-counter and prescription medicines only as told by your health care provider.  If you were prescribed an antibiotic medicine, take or apply it as told by your health care provider. Do not stop using the antibiotic even if your condition improves.  Clean the wound one time each day or as told by your health care provider.  Wash the wound with mild soap and water.  Rinse the wound with water to remove all soap.  Pat your wound dry with a clean towel. Do not rub the wound.  Do not inject anything into the wound unless your health care provider told you to.  Change any bandages (dressings) as told by your health care provider. This includes changing the dressing if it gets wet, dirty, or starts to smell bad.  Keep the dressing dry until your health care provider says it can be removed. Do not take baths, swim, or do anything that puts your wound underwater until your health care provider approves.  Raise (elevate) the injured area above the level  of your heart while you are sitting or lying down, if possible.  Do not scratch or pick at the wound.  Check your wound every day for signs of infection. Watch for:  Redness, swelling, or pain.  Fluid, blood, or pus.  Keep all follow-up visits as told by your health care provider. This is important. SEEK MEDICAL CARE IF:  You received a tetanus and shot and you have swelling, severe pain, redness, or bleeding at the injection site.   You have a fever.  Your pain is not controlled with medicine.  You have increased redness, swelling, or pain at the site of your wound.  You have fluid, blood, or pus coming from your wound.  You notice a bad smell coming from your wound or your dressing.  You notice something coming out of the wound, such as wood or glass.  You notice a change in the color of your skin near your wound.  You develop a new rash.  You need to change the dressing frequently due to fluid, blood, or pus draining from the wound.  You develop numbness around your wound. SEEK IMMEDIATE MEDICAL CARE IF:  Your pain suddenly increases and is severe.  You develop severe swelling around the wound.  The wound is on your hand or foot and you cannot properly move a finger or toe.  The wound is on your hand or foot and you notice that your fingers or toes look pale or bluish.  You have a red streak going away from your wound.   This information is not intended to replace advice given to you by your health care provider. Make sure you discuss any questions you have with your health care provider.   Document Released: 01/30/2006 Document Revised: 07/19/2014 Document Reviewed: 02/28/2014 Elsevier Interactive Patient Education [employer].

## 2015-01-14 NOTE — ED Notes (Signed)
Pt reports cutting her LT index and Lt middle  Finger with hedge clippers this AM

## 2015-01-14 NOTE — ED Notes (Signed)
Declined W/C at D/C and was escorted to lobby by RN. 

## 2015-01-14 NOTE — ED Provider Notes (Signed)
CSN: [id number]     Arrival date & time 01/14/15  1006 History  By signing my name below, I, Stephania Fragmin, attest that this documentation has been prepared under the direction and in the presence of Stark Aguinaga, PA-C. Electronically Signed: Stephania Fragmin, ED Scribe. 01/14/2015. 10:37 AM.   Chief Complaint  Patient presents with  . Laceration   The history is provided by the patient. No language interpreter was used.   HPI Comments: Valerie Wilson is a 64 y.o. female who presents to the Emergency Department complaining of two lacerations on the palmar side of her left index and middle fingers that onset PTA. Patient was using an Chief of Staff when she accidentally swung her left hand in front of the trimmer and was cut. She states she was wearing rubber gloves at the time and the clippers cut through to her finger. She complains of associated swelling to her index and middle fingers. Patient states that since being cut, she has been applying vigorous pressure to her hand to stop the bleeding, which has been effective. She states her last tetanus vaccination was 2 years ago. She takes a daily aspirin 81 mg but no other blood thinners. Denies numbness, tingling or loss of sensation in the finger tips. Denies weakness in the fingers and states that she can fully move her fingers without pain.   Past Medical History  Diagnosis Date  . Hypertension   . Fibroid 1998    fundal 1.5 cm  . PID (pelvic inflammatory disease) 1986    laparoscopy  . ASCUS on Pap smear 1994    x2 - colpo  . Diverticulosis     citrucel  . Cancer [facility]) 2009    endometrial adenocarcinoma  . Osteoarthritis of hand     --right  . Diverticulosis   . Pancreatitis   . Gallstone    Past Surgical History  Procedure Laterality Date  . Blepharoplasty  09/1998  . Abdominal hysterectomy  05/26/07    TAH/BSO- Grade 1 endometrial ad  . Carpal tunnel release Right 2004    Dr. Burney Gauze  . Plantar fascia release Right   .  Cholecystectomy, laparoscopic  11/2013    --Dr. Owens Loffler [facility]   Family History  Problem Relation Age of Onset  . Diabetes Mother   . Hypertension Mother   . Stroke Mother   . Hypertension Father   . Hypertension Sister   . Thyroid disease Sister    Social History  Substance Use Topics  . Smoking status: Never Smoker   . Smokeless tobacco: Never Used  . Alcohol Use: 3.6 oz/week    6 Standard drinks or equivalent per week     Comment: 5-6glasses of wine per week   OB History    Gravida Para Term Preterm AB TAB SAB Ectopic Multiple Living   0 0 0 0 0 0 0 0 0 0      Review of Systems  Musculoskeletal: Positive for joint swelling (swelling to left index and middle fingers). Negative for myalgias and arthralgias.  Skin: Positive for wound.    Allergies  Review of patient's allergies indicates no known allergies.  Home Medications   Prior to Admission medications   Medication Sig Start Date End Date Taking? Authorizing Provider  ALPRAZolam Duanne Moron) 0.5 MG tablet Take 0.5 mg by mouth 3 (three) times daily as needed for anxiety.    Historical Provider, MD  aspirin 81 MG tablet Take 81 mg by mouth daily.  Historical Provider, MD  budesonide (PULMICORT) 180 MCG/ACT inhaler Inhale 2 puffs into the lungs as needed.    Historical Provider, MD  calcium citrate (CALCITRATE - DOSED IN MG ELEMENTAL CALCIUM) 950 MG tablet Take 1,000 mg by mouth daily.    Historical Provider, MD  hydrochlorothiazide (MICROZIDE) 12.5 MG capsule Take 12.5 mg by mouth daily.    Historical Provider, MD  metroNIDAZOLE (FLAGYL) 500 MG tablet Take 1 tablet (500 mg total) by mouth 2 (two) times daily. 09/16/14   [place], MD  Omega-3 Fatty Acids (FISH OIL PO) Take by mouth.    Historical Provider, MD  ramipril (ALTACE) 10 MG capsule Take 10 mg by mouth daily. 09/07/14   Historical Provider, MD   BP 112/63 mmHg  Pulse 69  Temp(Src) 98.6 F (37 C) (Oral)  Resp 16  Ht 5\' 4"  (1.626 m)  Wt  190 lb (86.183 kg)  BMI 32.60 kg/m2  SpO2 99%  LMP 05/26/2007 Physical Exam  Constitutional: She appears well-developed and well-nourished. No distress.  HENT:  Head: Normocephalic and atraumatic.  Right Ear: External ear normal.  Left Ear: External ear normal.  Eyes: Conjunctivae are normal. Right eye exhibits no discharge. Left eye exhibits no discharge. No scleral icterus.  Neck: Normal range of motion.  Cardiovascular: Normal rate.   Pulmonary/Chest: Effort normal.  Musculoskeletal: Normal range of motion.  Full active ROM of digits involved without pain.   Neurological: She is alert. Coordination normal.  5/5 grip strength. Sensation to light touch intact over fingertips.   Skin: Skin is warm and dry.  Left index - 0.5 cm, superficial skin shearing wound to palmar aspect of middle phalanx. Bleeding well controlled. Base easily visualized after saline rinse. No foreign bodies.  Left middle - 1.0 cm superficial laceration to palmar aspect of middle phalanx. Bleeding well controlled. Base easily visualized after saline rinse. No foreign bodies.   Psychiatric: She has a normal mood and affect. Her behavior is normal.  Nursing note and vitals reviewed.   ED Course  Procedures (including critical care time)  DIAGNOSTIC STUDIES: Oxygen Saturation is 99% on RA, normal by my interpretation.    COORDINATION OF CARE: 10:35 AM - Discussed treatment plan with pt at bedside which includes closure with either Dermabond or Steri-strips. Laceration appears too superficial for suture repair. Pt verbalized understanding and agreed to plan.   Labs Review Labs Reviewed - No data to display  Imaging Review No results found. I have personally reviewed and evaluated these images and lab results as part of my medical decision-making.   EKG Interpretation None      MDM   Final diagnoses:  Finger laceration, initial encounter   Pt presenting with finger lacerations to palmar aspect of  left 2nd and 3rd digits. Pt was using hedge trimmers which cut through her gloves. Last tetanus within past 2 years. Bleeding controlled upon arrival to ED. Pt not complaining of current pain. Two small lacerations noted to palmar surface of index and middle digits. Fingers are neurovascularly intact. Full active ROM of digits. Lacerations are extremely shallow; unable to repair with sutures. Wounds rinsed and soaked in normal saline. Base of wounds visualized without foreign body present. Bacitracin and steri-strips applied with good wound closure. Pt has no comorbidities to effect normal wound healing.  Discussed wound home care with patient and answered questions; they are to return to the ED sooner for signs of infection. Pt is hemodynamically stable with no complaints prior to  dc. Return precautions given in discharge paperwork and discussed with pt at bedside. Pt stable for discharge  I personally performed the services described in this documentation, which was scribed in my presence. The recorded information has been reviewed and is accurate.     Josephina Gip, PA-C 01/14/15 1148  Harvel Quale, MD 01/14/15 2251

## 2015-07-28 IMAGING — US US ABDOMEN COMPLETE
1 series · 13 of 25 positions shown · non-contrast
Comparison: None.

CLINICAL DATA: Epigastric pain, evaluate for pancreatitis

EXAM:
ULTRASOUND ABDOMEN COMPLETE

[Series 1: us abdomen complete · 0.24mm/px · 13 of 82 slices shown]
[im 1/82]
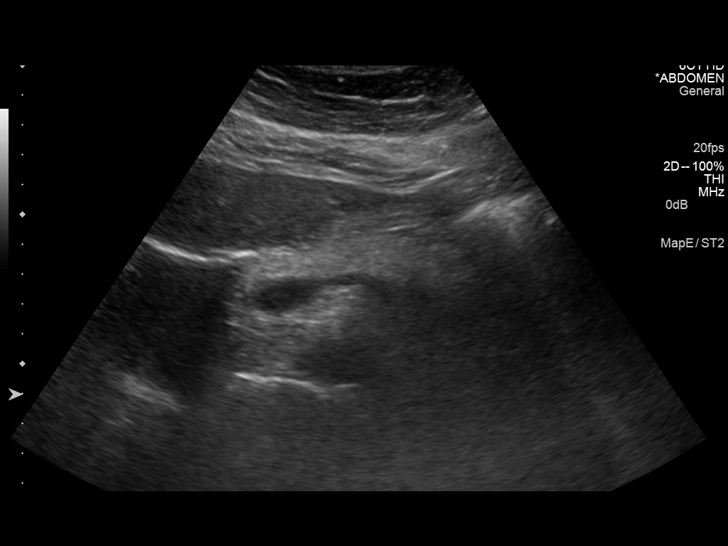
[im 7/82]
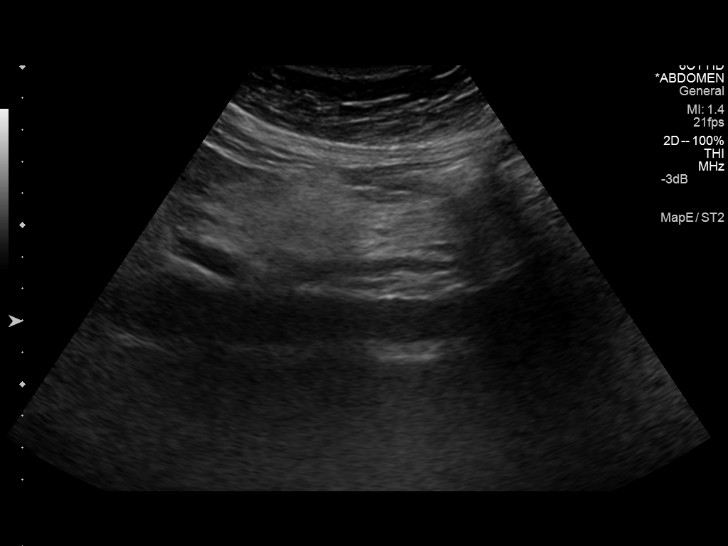
[im 14/82]
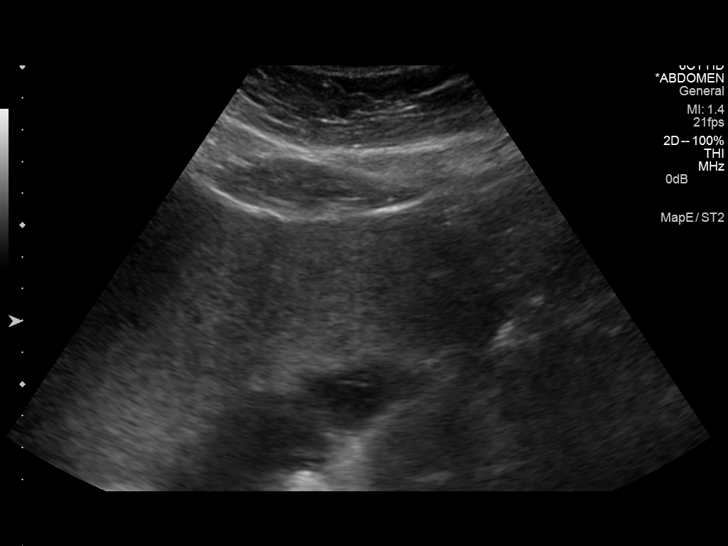
[im 21/82]
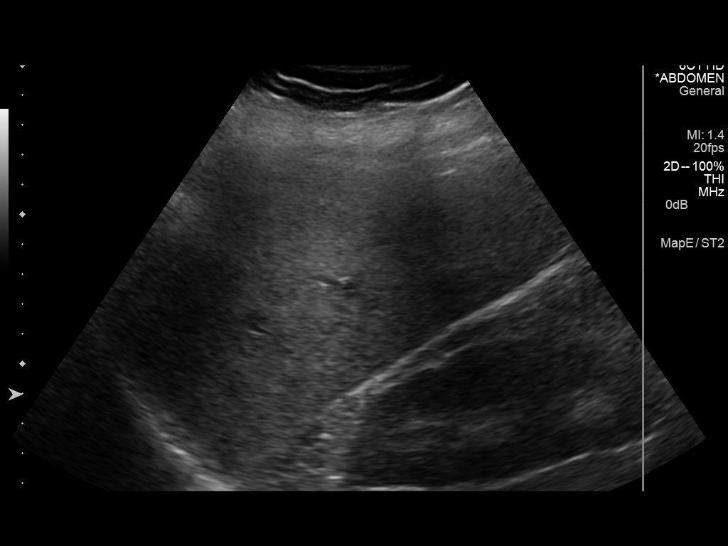
[im 28/82]
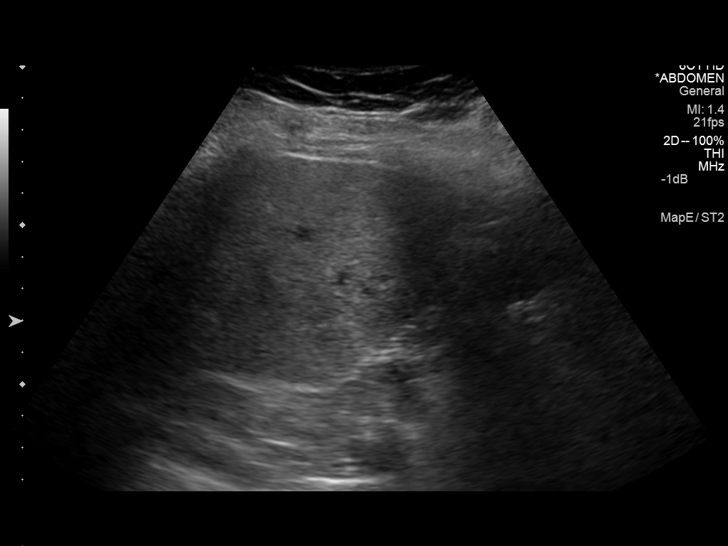
[im 34/82]
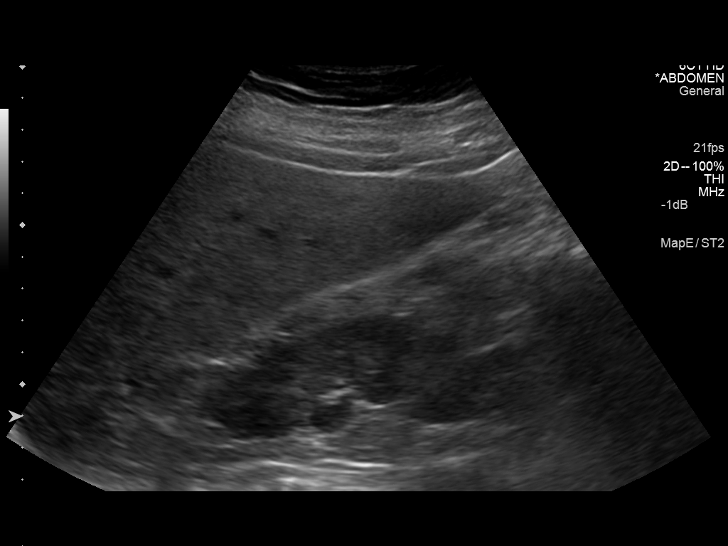
[im 41/82]
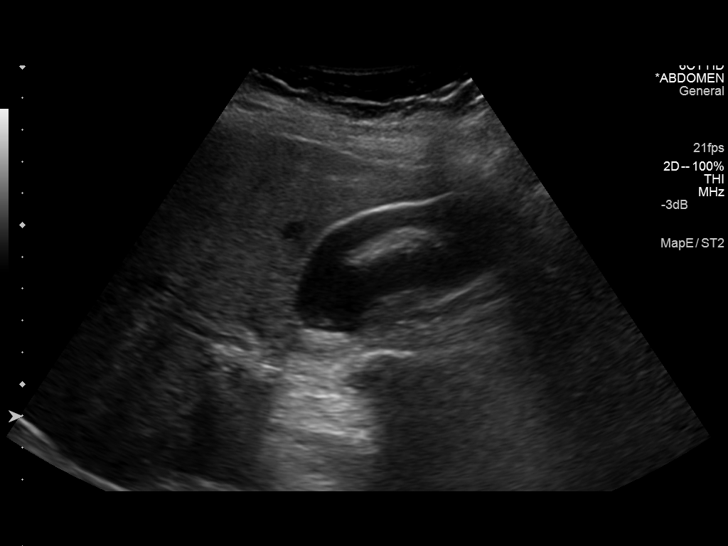
[im 48/82]
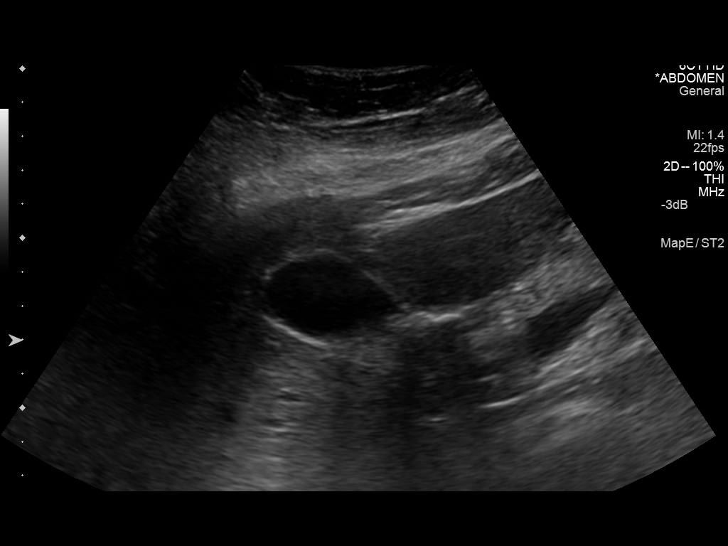
[im 55/82]
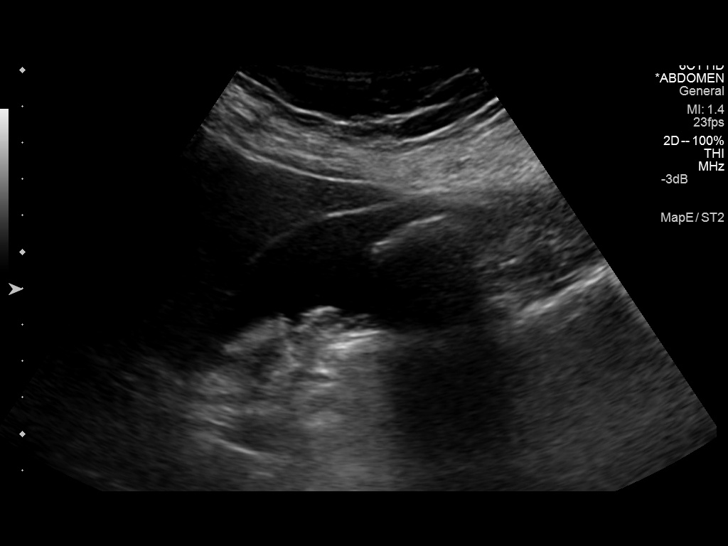
[im 61/82]
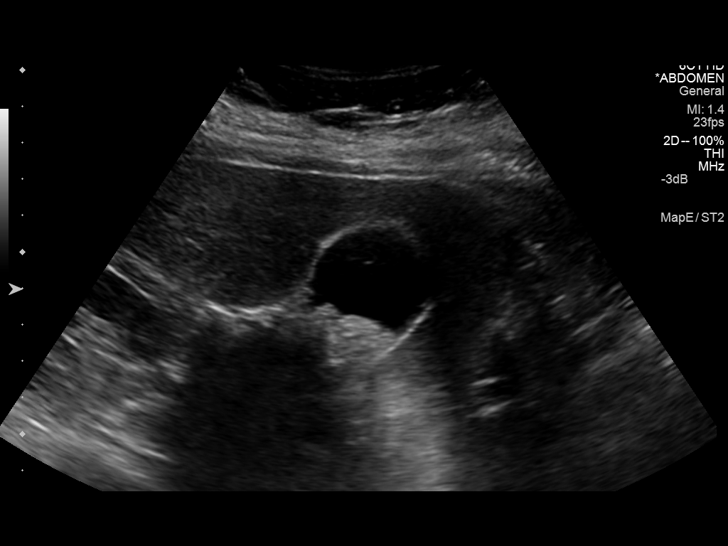
[im 68/82]
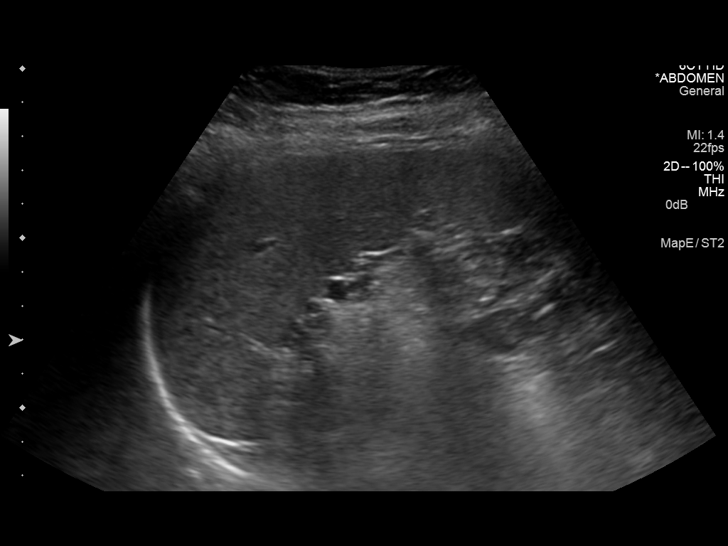
[im 75/82]
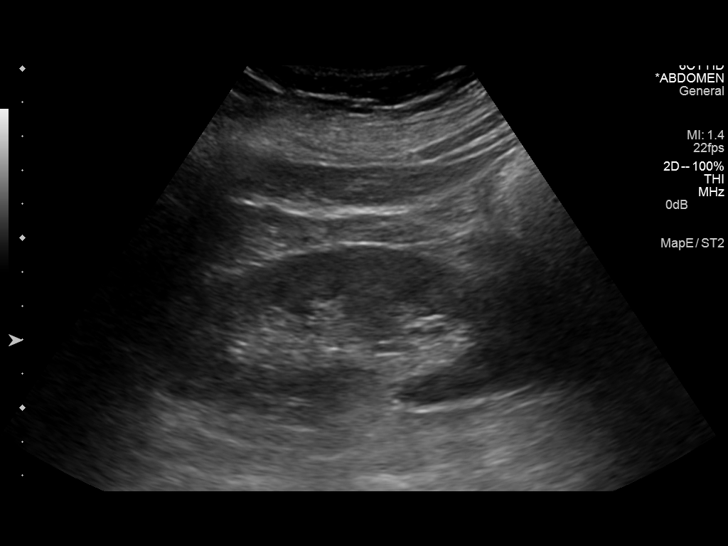
[im 82/82]
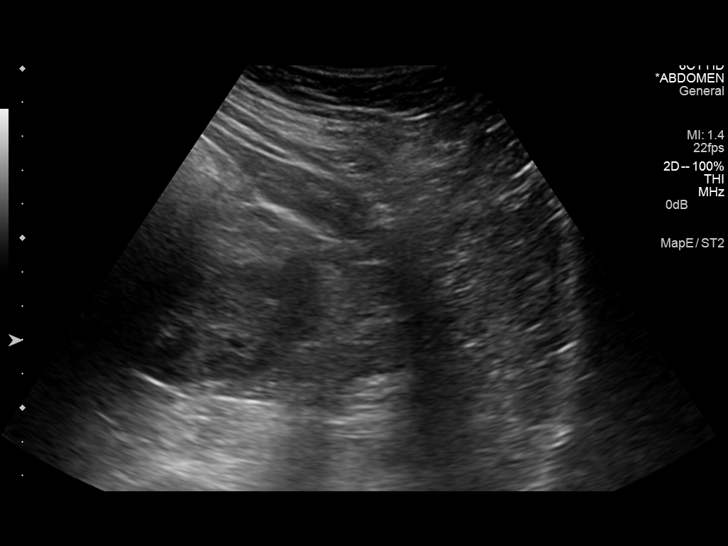

[13 of 25 positions shown; findings below may reference images not displayed]

FINDINGS: Gallbladder:

Multiple gallstones are present within the gallbladder, the largest
measuring 3.4 cm with acoustical shadowing. There is no pain over
the gallbladder with compression.

Common bile duct:

Diameter: The common bile duct is normal measuring 6.3 mm in
diameter.

Liver:

The liver is inhomogeneous and echogenic consistent with fatty
infiltration. No focal abnormality is seen.

IVC:

No abnormality visualized.

Pancreas:

The head and body of the pancreas is unremarkable. The tail of the
pancreas is obscured by bowel gas. No pancreatic ductal dilatation
is noted and there is no evidence of peripancreatic fluid.

Spleen:

The spleen is normal measuring 9.7 cm.

Right Kidney:

Length: 10.5 cm..  No hydronephrosis is seen.

Left Kidney:

Length: 11.4 cm..  No hydronephrosis is noted.

Abdominal aorta:

Abdominal aorta is normal in caliber.

Other findings:

None.
IMPRESSION: 1. Multiple gallstones, the largest of 3.4 cm. No pain over the
gallbladder with compression.
2. Fatty infiltration of the liver.
3. The tail of the pancreas is obscured by bowel gas. No ductal
dilatation or peripancreatic fluid is seen.

## 2015-10-04 ENCOUNTER — Encounter: Payer: Self-pay | Admitting: Obstetrics and Gynecology

## 2015-10-04 ENCOUNTER — Ambulatory Visit (INDEPENDENT_AMBULATORY_CARE_PROVIDER_SITE_OTHER): Payer: Medicare Other | Admitting: Obstetrics and Gynecology

## 2015-10-04 VITALS — BP 128/70 | HR 68 | Resp 14 | Ht 64.0 in | Wt 199.0 lb

## 2015-10-04 DIAGNOSIS — R8290 Unspecified abnormal findings in urine: Secondary | ICD-10-CM | POA: Diagnosis not present

## 2015-10-04 DIAGNOSIS — Z124 Encounter for screening for malignant neoplasm of cervix: Secondary | ICD-10-CM

## 2015-10-04 DIAGNOSIS — Z01419 Encounter for gynecological examination (general) (routine) without abnormal findings: Secondary | ICD-10-CM

## 2015-10-04 DIAGNOSIS — N760 Acute vaginitis: Secondary | ICD-10-CM | POA: Diagnosis not present

## 2015-10-04 NOTE — Patient Instructions (Signed)
Health Maintenance, Female Adopting a healthy lifestyle and getting preventive care can go a long way to promote health and wellness. Talk with your health care provider about what schedule of regular examinations is right for you. This is a good chance for you to check in with your provider about disease prevention and staying healthy. In between checkups, there are plenty of things you can do on your own. Experts have done a lot of research about which lifestyle changes and preventive measures are most likely to keep you healthy. Ask your health care provider for more information. WEIGHT AND DIET  Eat a healthy diet  Be sure to include plenty of vegetables, fruits, low-fat dairy products, and lean protein.  Do not eat a lot of foods high in solid fats, added sugars, or salt.  Get regular exercise. This is one of the most important things you can do for your health.  Most adults should exercise for at least 150 minutes each week. The exercise should increase your heart rate and make you sweat (moderate-intensity exercise).  Most adults should also do strengthening exercises at least twice a week. This is in addition to the moderate-intensity exercise.  Maintain a healthy weight  Body mass index (BMI) is a measurement that can be used to identify possible weight problems. It estimates body fat based on height and weight. Your health care provider can help determine your BMI and help you achieve or maintain a healthy weight.  For females 20 years of age and older:   A BMI below 18.5 is considered underweight.  A BMI of 18.5 to 24.9 is normal.  A BMI of 25 to 29.9 is considered overweight.  A BMI of 30 and above is considered obese.  Watch levels of cholesterol and blood lipids  You should start having your blood tested for lipids and cholesterol at 67 years of age, then have this test every 5 years.  You may need to have your cholesterol levels checked more often if:  Your lipid  or cholesterol levels are high.  You are older than 67 years of age.  You are at high risk for heart disease.  CANCER SCREENING   Lung Cancer  Lung cancer screening is recommended for adults 55-80 years old who are at high risk for lung cancer because of a history of smoking.  A yearly low-dose CT scan of the lungs is recommended for people who:  Currently smoke.  Have quit within the past 15 years.  Have at least a 30-pack-year history of smoking. A pack year is smoking an average of one pack of cigarettes a day for 1 year.  Yearly screening should continue until it has been 15 years since you quit.  Yearly screening should stop if you develop a health problem that would prevent you from having lung cancer treatment.  Breast Cancer  Practice breast self-awareness. This means understanding how your breasts normally appear and feel.  It also means doing regular breast self-exams. Let your health care provider know about any changes, no matter how small.  If you are in your 20s or 30s, you should have a clinical breast exam (CBE) by a health care provider every 1-3 years as part of a regular health exam.  If you are 40 or older, have a CBE every year. Also consider having a breast X-ray (mammogram) every year.  If you have a family history of breast cancer, talk to your health care provider about genetic screening.  If you   are at high risk for breast cancer, talk to your health care provider about having an MRI and a mammogram every year.  Breast cancer gene (BRCA) assessment is recommended for women who have family members with BRCA-related cancers. BRCA-related cancers include:  Breast.  Ovarian.  Tubal.  Peritoneal cancers.  Results of the assessment will determine the need for genetic counseling and BRCA1 and BRCA2 testing. Cervical Cancer Your health care provider may recommend that you be screened regularly for cancer of the pelvic organs (ovaries, uterus, and  vagina). This screening involves a pelvic examination, including checking for microscopic changes to the surface of your cervix (Pap test). You may be encouraged to have this screening done every 3 years, beginning at age 21.  For women ages 30-65, health care providers may recommend pelvic exams and Pap testing every 3 years, or they may recommend the Pap and pelvic exam, combined with testing for human papilloma virus (HPV), every 5 years. Some types of HPV increase your risk of cervical cancer. Testing for HPV may also be done on women of any age with unclear Pap test results.  Other health care providers may not recommend any screening for nonpregnant women who are considered low risk for pelvic cancer and who do not have symptoms. Ask your health care provider if a screening pelvic exam is right for you.  If you have had past treatment for cervical cancer or a condition that could lead to cancer, you need Pap tests and screening for cancer for at least 20 years after your treatment. If Pap tests have been discontinued, your risk factors (such as having a new sexual partner) need to be reassessed to determine if screening should resume. Some women have medical problems that increase the chance of getting cervical cancer. In these cases, your health care provider may recommend more frequent screening and Pap tests. Colorectal Cancer  This type of cancer can be detected and often prevented.  Routine colorectal cancer screening usually begins at 67 years of age and continues through 67 years of age.  Your health care provider may recommend screening at an earlier age if you have risk factors for colon cancer.  Your health care provider may also recommend using home test kits to check for hidden blood in the stool.  A small camera at the end of a tube can be used to examine your colon directly (sigmoidoscopy or colonoscopy). This is done to check for the earliest forms of colorectal  cancer.  Routine screening usually begins at age 50.  Direct examination of the colon should be repeated every 5-10 years through 67 years of age. However, you may need to be screened more often if early forms of precancerous polyps or small growths are found. Skin Cancer  Check your skin from head to toe regularly.  Tell your health care provider about any new moles or changes in moles, especially if there is a change in a mole's shape or color.  Also tell your health care provider if you have a mole that is larger than the size of a pencil eraser.  Always use sunscreen. Apply sunscreen liberally and repeatedly throughout the day.  Protect yourself by wearing long sleeves, pants, a wide-brimmed hat, and sunglasses whenever you are outside. HEART DISEASE, DIABETES, AND HIGH BLOOD PRESSURE   High blood pressure causes heart disease and increases the risk of stroke. High blood pressure is more likely to develop in:  People who have blood pressure in the high end   of the normal range (130-139/85-89 mm Hg).  People who are overweight or obese.  People who are African American.  If you are 38-23 years of age, have your blood pressure checked every 3-5 years. If you are 61 years of age or older, have your blood pressure checked every year. You should have your blood pressure measured twice--once when you are at a hospital or clinic, and once when you are not at a hospital or clinic. Record the average of the two measurements. To check your blood pressure when you are not at a hospital or clinic, you can use:  An automated blood pressure machine at a pharmacy.  A home blood pressure monitor.  If you are between 45 years and 39 years old, ask your health care provider if you should take aspirin to prevent strokes.  Have regular diabetes screenings. This involves taking a blood sample to check your fasting blood sugar level.  If you are at a normal weight and have a low risk for diabetes,  have this test once every three years after 68 years of age.  If you are overweight and have a high risk for diabetes, consider being tested at a younger age or more often. PREVENTING INFECTION  Hepatitis B  If you have a higher risk for hepatitis B, you should be screened for this virus. You are considered at high risk for hepatitis B if:  You were born in a country where hepatitis B is common. Ask your health care provider which countries are considered high risk.  Your parents were born in a high-risk country, and you have not been immunized against hepatitis B (hepatitis B vaccine).  You have HIV or AIDS.  You use needles to inject street drugs.  You live with someone who has hepatitis B.  You have had sex with someone who has hepatitis B.  You get hemodialysis treatment.  You take certain medicines for conditions, including cancer, organ transplantation, and autoimmune conditions. Hepatitis C  Blood testing is recommended for:  Everyone born from [birth year] through 1967.  Anyone with known risk factors for hepatitis C. Sexually transmitted infections (STIs)  You should be screened for sexually transmitted infections (STIs) including gonorrhea and chlamydia if:  You are sexually active and are younger than 67 years of age.  You are older than 67 years of age and your health care provider tells you that you are at risk for this type of infection.  Your sexual activity has changed since you were last screened and you are at an increased risk for chlamydia or gonorrhea. Ask your health care provider if you are at risk.  If you do not have HIV, but are at risk, it may be recommended that you take a prescription medicine daily to prevent HIV infection. This is called pre-exposure prophylaxis (PrEP). You are considered at risk if:  You are sexually active and do not regularly use condoms or know the HIV status of your partner(s).  You take drugs by injection.  You are sexually  active with a partner who has HIV. Talk with your health care provider about whether you are at high risk of being infected with HIV. If you choose to begin PrEP, you should first be tested for HIV. You should then be tested every 3 months for as long as you are taking PrEP.  PREGNANCY   If you are premenopausal and you may become pregnant, ask your health care provider about preconception counseling.  If you may  become pregnant, take 400 to 800 micrograms (mcg) of folic acid every day.  If you want to prevent pregnancy, talk to your health care provider about birth control (contraception). OSTEOPOROSIS AND MENOPAUSE   Osteoporosis is a disease in which the bones lose minerals and strength with aging. This can result in serious bone fractures. Your risk for osteoporosis can be identified using a bone density scan.  If you are 61 years of age or older, or if you are at risk for osteoporosis and fractures, ask your health care provider if you should be screened.  Ask your health care provider whether you should take a calcium or vitamin D supplement to lower your risk for osteoporosis.  Menopause may have certain physical symptoms and risks.  Hormone replacement therapy may reduce some of these symptoms and risks. Talk to your health care provider about whether hormone replacement therapy is right for you.  HOME CARE INSTRUCTIONS   Schedule regular health, dental, and eye exams.  Stay current with your immunizations.   Do not use any tobacco products including cigarettes, chewing tobacco, or electronic cigarettes.  If you are pregnant, do not drink alcohol.  If you are breastfeeding, limit how much and how often you drink alcohol.  Limit alcohol intake to no more than 1 drink per day for nonpregnant women. One drink equals 12 ounces of beer, 5 ounces of wine, or 1 ounces of hard liquor.  Do not use street drugs.  Do not share needles.  Ask your health care provider for help if  you need support or information about quitting drugs.  Tell your health care provider if you often feel depressed.  Tell your health care provider if you have ever been abused or do not feel safe at home.   This information is not intended to replace advice given to you by your health care provider. Make sure you discuss any questions you have with your health care provider.   Document Released: 09/17/2010 Document Revised: 03/25/2014 Document Reviewed: 02/03/2013 Elsevier Interactive Patient Education [employer].

## 2015-10-04 NOTE — Progress Notes (Signed)
65 y.o. Valerie Wilson Married Caucasian female here for annual exam.    No vaginal bleeding, drainage or pelvic pain.  No pain with urination, pelvic pressure, or rectal bleeding.  Does note that her urine is darker recently.  Wants urine evaluation today   Did have change in stool color after eating beets.  This resolved.   Has periodic vaginal itching and burning.  Some itching today.  Wants evaluation for this.  Treated for BV last year.  Increased urinary incontinence with weight gain.  Not doing aerobic activity.    Normal blood sugar levels - 99. No HgbA1C testing.  Not on medication to control blood sugar.   PCP:   Kathryne Eriksson, MD  Patient's last menstrual period was 05/26/2007.           Sexually active: Yes.    The current method of family planning is status post hysterectomy.    Exercising: No.  The patient does not participate in regular exercise at present. Smoker:  no  Health Maintenance: Pap:  09/14/14 Pap negative History of abnormal Pap:  yes MMG:  06/07/15 BI-RADS1 negative Colonoscopy:  03/2009 wnl Dr. Olevia Perches. Next due 03/2019 BMD:   04/09/13  Result  wnl TDaP:  2011 Screening Labs:  Hb today: PCP, Urine today: patient has given a urine sample due to vaginal irritation and dark colored urine   reports that she has never smoked. She has never used smokeless tobacco. She reports that she drinks about 3.6 oz of alcohol per week. She reports that she does not use illicit drugs.  Past Medical History  Diagnosis Date  . Hypertension   . Fibroid 1998    fundal 1.5 cm  . PID (pelvic inflammatory disease) 1986    laparoscopy  . ASCUS on Pap smear 1994    x2 - colpo  . Diverticulosis     citrucel  . Cancer [facility]) 2009    endometrial adenocarcinoma  . Osteoarthritis of hand     --right  . Diverticulosis   . Pancreatitis   . Gallstone     Past Surgical History  Procedure Laterality Date  . Blepharoplasty  09/1998  . Abdominal hysterectomy  05/26/07   TAH/BSO- Grade 1 endometrial ad  . Carpal tunnel release Right 2004    Dr. Burney Gauze  . Plantar fascia release Right   . Cholecystectomy, laparoscopic  11/2013    --Dr. Owens Loffler [facility]    Current Outpatient Prescriptions  Medication Sig Dispense Refill  . ALPRAZolam (XANAX) 0.5 MG tablet Take 0.5 mg by mouth 3 (three) times daily as needed for anxiety.    Marland Kitchen aspirin 81 MG tablet Take 81 mg by mouth daily.    . budesonide (PULMICORT) 180 MCG/ACT inhaler Inhale 2 puffs into the lungs as needed.    . calcium citrate (CALCITRATE - DOSED IN MG ELEMENTAL CALCIUM) 950 MG tablet Take 1,000 mg by mouth daily.    . fluticasone (FLONASE) 50 MCG/ACT nasal spray Place into the nose.    . hydrochlorothiazide (MICROZIDE) 12.5 MG capsule Take 12.5 mg by mouth daily.    . Omega-3 Fatty Acids (FISH OIL PO) Take by mouth.    . ramipril (ALTACE) 10 MG capsule Take 10 mg by mouth daily.  3   No current facility-administered medications for this visit.    Family History  Problem Relation Age of Onset  . Diabetes Mother   . Hypertension Mother   . Stroke Mother   . Hypertension Father   . Hypertension  Sister   . Thyroid disease Sister     ROS:  Pertinent items are noted in HPI.  Otherwise, a comprehensive ROS was negative.  Exam:   BP 128/70 mmHg  Pulse 68  Resp 14  Ht 5\' 4"  (1.626 m)  Wt 199 lb (90.266 kg)  BMI 34.14 kg/m2  LMP 05/26/2007    General appearance: alert, cooperative and appears stated age Head: Normocephalic, without obvious abnormality, atraumatic Neck: no adenopathy, supple, symmetrical, trachea midline and thyroid normal to inspection and palpation Lungs: clear to auscultation bilaterally Breasts: normal appearance, no masses or tenderness, Inspection negative, No nipple retraction or dimpling, No nipple discharge or bleeding, No axillary or supraclavicular adenopathy Heart: regular rate and rhythm Abdomen: incisions:  Yes.     Vertical midline , soft, non-tender; no  masses, no organomegaly Extremities: extremities normal, atraumatic, no cyanosis or edema Skin: Skin color, texture, turgor normal. No rashes or lesions Lymph nodes: Cervical, supraclavicular, and axillary nodes normal. No abnormal inguinal nodes palpated Neurologic: Grossly normal  Pelvic: External genitalia:  no lesions              Urethra:  normal appearing urethra with no masses, tenderness or lesions              Bartholins and Skenes: normal                 Vagina: normal appearing vagina with normal color and discharge, no lesions              Cervix: absent and no lesions.  Some erythema consistent with               Pap taken: No. Bimanual Exam:  Uterus:  uterus absent              Adnexa: normal adnexa and no mass, fullness, tenderness              Rectal exam: Yes.  .  Confirms.              Anus:  normal sphincter tone, no lesions.  Stool in rectum.   Chaperone was present for exam.  Assessment:   Well woman visit with normal exam. Status post TAH/BSO for endometrial CA.  No sign of recurrence. Abnormal urine odor.  Trace WBCs.  Vaginitis and vaginal atrophy.   Plan: Yearly mammogram recommended after age 85.  Recommended self breast exam.  Pap and HR HPV as above. Discussed Calcium, Vitamin D, regular exercise program including cardiovascular and weight bearing exercise. Labs performed.  Yes.  .   See orders.  Urine culture.  Affirm testing.  Prescription medication(s) given.  No..    Patient will try vaginal vit E suppositories.  Will wait for UC and Affirm results to initiate any tx.  Follow up annually and prn.       After visit summary provided.

## 2015-10-05 LAB — WET PREP BY MOLECULAR PROBE
CANDIDA SPECIES: NEGATIVE
GARDNERELLA VAGINALIS: NEGATIVE
Trichomonas vaginosis: NEGATIVE

## 2015-10-06 LAB — URINE CULTURE: Organism ID, Bacteria: NO GROWTH

## 2015-12-29 ENCOUNTER — Telehealth: Payer: Self-pay | Admitting: Obstetrics and Gynecology

## 2015-12-29 NOTE — Telephone Encounter (Signed)
Spoke with patient. Patient states she feels like her "hormones are imbalanced". Patient reports increase in night sweats. Patient states that she feels like she may have some depression. Patient states it has been a horrible year and she feels like she just can't get out of this funk. Patient states years ago she was on prozac for approximately 6 months, prescribed by Dr. Joan Flores. Patient denies any suicidal ideations and reports feeling safe. Advised patient she would need to come in for OV for further evaluation. Patient scheduled for 01/01/16 at 9:30am with Dr. Quincy Simmonds. Advised patient should feeling change/worsen to call office or visit ER/Urgent care. Patient verbalized understanding and is agreeable to date and time.      Routing to provider for final review. Patient is agreeable to disposition. Will close encounter.

## 2015-12-29 NOTE — Telephone Encounter (Signed)
Patient would like to speak with a nurse or Dr Quincy Simmonds.  She has some questions.  Patient thinks she may have some hormone imbalances, experiencing some depression issues.

## 2016-01-01 ENCOUNTER — Ambulatory Visit (INDEPENDENT_AMBULATORY_CARE_PROVIDER_SITE_OTHER): Payer: Medicare Other | Admitting: Obstetrics and Gynecology

## 2016-01-01 ENCOUNTER — Encounter: Payer: Self-pay | Admitting: Obstetrics and Gynecology

## 2016-01-01 VITALS — BP 138/80 | HR 60 | Ht 64.0 in | Wt 206.4 lb

## 2016-01-01 DIAGNOSIS — F32A Depression, unspecified: Secondary | ICD-10-CM

## 2016-01-01 DIAGNOSIS — N951 Menopausal and female climacteric states: Secondary | ICD-10-CM

## 2016-01-01 DIAGNOSIS — F329 Major depressive disorder, single episode, unspecified: Secondary | ICD-10-CM

## 2016-01-01 DIAGNOSIS — Z119 Encounter for screening for infectious and parasitic diseases, unspecified: Secondary | ICD-10-CM

## 2016-01-01 DIAGNOSIS — F3289 Other specified depressive episodes: Secondary | ICD-10-CM

## 2016-01-01 MED ORDER — FLUOXETINE HCL 10 MG PO TABS: 10.0000 mg | ORAL_TABLET | Freq: Every day | ORAL | 5 refills | Status: DC

## 2016-01-01 NOTE — Progress Notes (Signed)
GYNECOLOGY  VISIT   HPI: 65 y.o.   Married  Caucasian  female   G0P0000 with Patient's last menstrual period was 05/26/2007.   here for evaluation of hot flashes, night sweats and some depression.   Waking up with sweating.  Feels warm when everyone else is cold.   Feels in a funk without reason.  Stress from downsizing but everything else is really good.  Has activities planned but does not want to do any of it. Denies suicidal ideation.   Took Prozac in the past for similar symptoms.  Dr. Marvia Pickles prescribed.  Took it for 6 months and then stopped.  Would like to restart this.  Worried about the winter coming and long dark days.  Has a light she uses for therapy.  Has seen a therapist in the past, but she retired.   Mother suffered from depression type symptoms and became a "bitter person."  Had routine labs with PCP this summer and had normal CMP, CBC, and cholesterol total.  Not exercising due to plantar fasciitis.  Tx with prednisone for one week. Not currently on this.  Wants Hep C testing.  Donated blood about 6 years ago.   GYNECOLOGIC HISTORY: Patient's last menstrual period was 05/26/2007. Contraception:  Hysterectomy Menopausal hormone therapy:  None Last mammogram:  06-07-15 3D/Density B/Neg/BiRads1:[facility] Last pap smear:  09-14-14 Neg        OB History    Gravida Para Term Preterm AB Living   0 0 0 0 0 0   SAB TAB Ectopic Multiple Live Births   0 0 0 0           Patient Active Problem List   Diagnosis Date Noted  . Pancreatitis 11/30/2013    Class: History of  . Endometrial cancer ([place]) 08/26/2012  . ASTHMA 04/07/2007  . ALLERGIC RHINITIS 04/06/2007    Past Medical History:  Diagnosis Date  . ASCUS on Pap smear 1994   x2 - colpo  . Cancer [facility]) 2009   endometrial adenocarcinoma  . Diverticulosis    citrucel  . Diverticulosis   . Fibroid 1998   fundal 1.5 cm  . Gallstone   . Hypertension   . Osteoarthritis of hand    --right  .  Pancreatitis   . PID (pelvic inflammatory disease) 1986   laparoscopy    Past Surgical History:  Procedure Laterality Date  . ABDOMINAL HYSTERECTOMY  05/26/07   TAH/BSO- Grade 1 endometrial ad  . BLEPHAROPLASTY  09/1998  . CARPAL TUNNEL RELEASE Right 2004   Dr. Burney Gauze  . CHOLECYSTECTOMY, LAPAROSCOPIC  11/2013   --Dr. Owens Loffler [facility]  . PLANTAR FASCIA RELEASE Right     Current Outpatient Prescriptions  Medication Sig Dispense Refill  . ALPRAZolam (XANAX) 0.5 MG tablet Take 0.5 mg by mouth 3 (three) times daily as needed for anxiety. Patient takes when travels    . aspirin 81 MG tablet Take 81 mg by mouth daily.    . budesonide (PULMICORT) 180 MCG/ACT inhaler Inhale 2 puffs into the lungs as needed.    . calcium citrate (CALCITRATE - DOSED IN MG ELEMENTAL CALCIUM) 950 MG tablet Take 1,000 mg by mouth daily.    . fluticasone (FLONASE) 50 MCG/ACT nasal spray Place into the nose.    . hydrochlorothiazide (MICROZIDE) 12.5 MG capsule Take 12.5 mg by mouth daily.    . Omega-3 Fatty Acids (FISH OIL PO) Take by mouth.    . ramipril (ALTACE) 10 MG capsule Take 10 mg by  mouth daily.  3   No current facility-administered medications for this visit.      ALLERGIES: Review of patient's allergies indicates no known allergies.  Family History  Problem Relation Age of Onset  . Diabetes Mother   . Hypertension Mother   . Stroke Mother   . Hypertension Father   . Hypertension Sister   . Thyroid disease Sister     Social History   Social History  . Marital status: Married    Spouse name: N/A  . Number of children: 2  . Years of education: N/A   Occupational History  . Accountant    Social History Main Topics  . Smoking status: Never Smoker  . Smokeless tobacco: Never Used  . Alcohol use 3.6 oz/week    6 Standard drinks or equivalent per week     Comment: 5-6glasses of wine per week  . Drug use: No  . Sexual activity: Yes    Partners: Male    Birth control/  protection: Surgical     Comment: Vasectomy/TAH/BSO   Other Topics Concern  . Not on file   Social History Narrative  . No narrative on file    ROS:  Pertinent items are noted in HPI.  PHYSICAL EXAMINATION:    BP 138/80 (BP Location: Right Arm, Patient Position: Sitting, Cuff Size: Large)   Pulse 60   Ht 5\' 4"  (1.626 m)   Wt 206 lb 6.4 oz (93.6 kg)   LMP 05/26/2007   BMI 35.43 kg/m     General appearance: alert, cooperative and appears stated age  ASSESSMENT  Status post TAH/BSO for endometrial CA. Vasomotor symptoms.  Depression.  Infectious disease screening.   PLAN  Check TFTS and hep C aby. Discussion of depression symptoms and tx options.  Start Prozac 10 mg daily, #30, RF 5.  Will call is no improvement in 2 weeks.  Will then increase to 20 mg daily. Discussed side effects.  Name and phone number for Marya Amsler for counseling.  She will call if she feels she is worsening.  I discussed exercise which she is limited in at this time. Follow up in 6 weeks.    An After Visit Summary was printed and given to the patient.  _25_____ minutes face to face time of which over 50% was spent in counseling.

## 2016-01-01 NOTE — Patient Instructions (Signed)
Fluoxetine capsules or tablets (Depression/Mood Disorders) What is this medicine? FLUOXETINE (floo OX e teen) belongs to a class of drugs known as selective serotonin reuptake inhibitors (SSRIs). It helps to treat mood problems such as depression, obsessive compulsive disorder, and panic attacks. It can also treat certain eating disorders. This medicine may be used for other purposes; ask your health care provider or pharmacist if you have questions. What should I tell my health care provider before I take this medicine? They need to know if you have any of these conditions: -bipolar disorder or mania -diabetes -glaucoma -liver disease -psychosis -seizures -suicidal thoughts or history of attempted suicide -an unusual or allergic reaction to fluoxetine, other medicines, foods, dyes, or preservatives -pregnant or trying to get pregnant -breast-feeding How should I use this medicine? Take this medicine by mouth with a glass of water. Follow the directions on the prescription label. You can take this medicine with or without food. Take your medicine at regular intervals. Do not take it more often than directed. Do not stop taking this medicine suddenly except upon the advice of your doctor. Stopping this medicine too quickly may cause serious side effects or your condition may worsen. A special MedGuide will be given to you by the pharmacist with each prescription and refill. Be sure to read this information carefully each time. Talk to your pediatrician regarding the use of this medicine in children. While this drug may be prescribed for children as young as 7 years for selected conditions, precautions do apply. Overdosage: If you think you have taken too much of this medicine contact a poison control center or emergency room at once. NOTE: This medicine is only for you. Do not share this medicine with others. What if I miss a dose? If you miss a dose, skip the missed dose and go back to your  regular dosing schedule. Do not take double or extra doses. What may interact with this medicine? Do not take fluoxetine with any of the following medications: -other medicines containing fluoxetine, like Sarafem or Symbyax -cisapride -linezolid -MAOIs like Carbex, Eldepryl, Marplan, Nardil, and Parnate -methylene blue (injected into a vein) -pimozide -thioridazine This medicine may also interact with the following medications: -alcohol -aspirin and aspirin-like medicines -carbamazepine -certain medicines for depression, anxiety, or psychotic disturbances -certain medicines for migraine headaches like almotriptan, eletriptan, frovatriptan, naratriptan, rizatriptan, sumatriptan, zolmitriptan -digoxin -diuretics -fentanyl -flecainide -furazolidone -isoniazid -lithium -medicines for sleep -medicines that treat or prevent blood clots like warfarin, enoxaparin, and dalteparin -NSAIDs, medicines for pain and inflammation, like ibuprofen or naproxen -phenytoin -procarbazine -propafenone -rasagiline -ritonavir -supplements like St. John's wort, kava kava, valerian -tramadol -tryptophan -vinblastine This list may not describe all possible interactions. Give your health care provider a list of all the medicines, herbs, non-prescription drugs, or dietary supplements you use. Also tell them if you smoke, drink alcohol, or use illegal drugs. Some items may interact with your medicine. What should I watch for while using this medicine? Tell your doctor if your symptoms do not get better or if they get worse. Visit your doctor or health care professional for regular checks on your progress. Because it may take several weeks to see the full effects of this medicine, it is important to continue your treatment as prescribed by your doctor. Patients and their families should watch out for new or worsening thoughts of suicide or depression. Also watch out for sudden changes in feelings such as  feeling anxious, agitated, panicky, irritable, hostile, aggressive, impulsive, severely restless,   overly excited and hyperactive, or not being able to sleep. If this happens, especially at the beginning of treatment or after a change in dose, call your health care professional. You may get drowsy or dizzy. Do not drive, use machinery, or do anything that needs mental alertness until you know how this medicine affects you. Do not stand or sit up quickly, especially if you are an older patient. This reduces the risk of dizzy or fainting spells. Alcohol may interfere with the effect of this medicine. Avoid alcoholic drinks. Your mouth may get dry. Chewing sugarless gum or sucking hard candy, and drinking plenty of water may help. Contact your doctor if the problem does not go away or is severe. This medicine may affect blood sugar levels. If you have diabetes, check with your doctor or health care professional before you change your diet or the dose of your diabetic medicine. What side effects may I notice from receiving this medicine? Side effects that you should report to your doctor or health care professional as soon as possible: -allergic reactions like skin rash, itching or hives, swelling of the face, lips, or tongue -breathing problems -confusion -eye pain, changes in vision -fast or irregular heart rate, palpitations -flu-like fever, chills, cough, muscle or joint aches and pains -seizures -suicidal thoughts or other mood changes -swelling or redness in or around the eye -tremors -trouble sleeping -unusual bleeding or bruising -unusually tired or weak -vomiting Side effects that usually do not require medical attention (report to your doctor or health care professional if they continue or are bothersome): -change in sex drive or performance -diarrhea -dry mouth -flushing -headache -increased or decreased appetite -nausea -sweating This list may not describe all possible side  effects. Call your doctor for medical advice about side effects. You may report side effects to FDA at 1-800-FDA-1088. Where should I keep my medicine? Keep out of the reach of children. Store at room temperature between 15 and 30 degrees C (59 and 86 degrees F). Throw away any unused medicine after the expiration date. NOTE: This sheet is a summary. It may not cover all possible information. If you have questions about this medicine, talk to your doctor, pharmacist, or health care provider.    2016, Elsevier/Gold Standard. (2014-02-25 12:40:07)  

## 2016-01-02 LAB — THYROID PANEL WITH TSH
Free Thyroxine Index: 1.9 (ref 1.4–3.8)
T3 UPTAKE: 28 % (ref 22–35)
T4, Total: 6.7 ug/dL (ref 4.5–12.0)
TSH: 2.2 mIU/L

## 2016-01-02 LAB — HEPATITIS C ANTIBODY: HCV Ab: NEGATIVE

## 2016-02-14 ENCOUNTER — Ambulatory Visit: Payer: Medicare Other | Admitting: Obstetrics and Gynecology

## 2016-09-06 ENCOUNTER — Encounter: Payer: Self-pay | Admitting: Obstetrics and Gynecology

## 2016-10-17 ENCOUNTER — Encounter: Payer: Self-pay | Admitting: Obstetrics and Gynecology

## 2016-10-17 ENCOUNTER — Ambulatory Visit (INDEPENDENT_AMBULATORY_CARE_PROVIDER_SITE_OTHER): Payer: Medicare Other | Admitting: Obstetrics and Gynecology

## 2016-10-17 ENCOUNTER — Other Ambulatory Visit (HOSPITAL_COMMUNITY)
Admission: RE | Admit: 2016-10-17 | Discharge: 2016-10-17 | Disposition: A | Discharge status: Home / Self Care | Payer: Medicare Other | Source: Ambulatory Visit | Attending: Obstetrics and Gynecology | Admitting: Obstetrics and Gynecology

## 2016-10-17 VITALS — BP 120/72 | HR 76 | Resp 16 | Ht 64.0 in | Wt 194.0 lb

## 2016-10-17 DIAGNOSIS — Z124 Encounter for screening for malignant neoplasm of cervix: Secondary | ICD-10-CM | POA: Diagnosis not present

## 2016-10-17 DIAGNOSIS — Z8542 Personal history of malignant neoplasm of other parts of uterus: Secondary | ICD-10-CM

## 2016-10-17 DIAGNOSIS — Z01419 Encounter for gynecological examination (general) (routine) without abnormal findings: Secondary | ICD-10-CM | POA: Diagnosis not present

## 2016-10-17 NOTE — Patient Instructions (Signed)

## 2016-10-17 NOTE — Progress Notes (Signed)
66 y.o. G0P0000 Married Caucasian female here for annual exam.    No vaginal bleeding, pelvic pain, or changes in bladder or bowel function or control. Some minor leakage of urine with cough or sneeze.   Joined [employer] and goal to loose 25 pounds. Knows this will help with her bladder control.  She did not take Prozac that I prescribed last year.  She did counseling instead.  Step daughter married this summer.   PCP:  Dr. Kathryne Eriksson   Patient's last menstrual period was 05/26/2007.           Sexually active: Yes.    The current method of family planning is status post hysterectomy.    Exercising: No.  The patient does not participate in regular exercise at present. Smoker:  no  Health Maintenance: Pap:  09/14/14 Pap negative  09/08/13 Pap smear Negative History of abnormal Pap:  Yes, years ago per patient MMG:  08/07/16 BRIADS 1 negative/density b Colonoscopy:  03/2009 wnl Dr. Olevia Perches. Next due 03/2019 BMD:   04/09/13  Result  Normal TDaP:  2011 Hep C: 01/01/16 Negative Screening Labs:  PCP takes care of labs   reports that she has never smoked. She has never used smokeless tobacco. She reports that she drinks about 3.6 oz of alcohol per week . She reports that she does not use drugs.  Past Medical History:  Diagnosis Date  . ASCUS on Pap smear 1994   x2 - colpo  . Cancer [facility]) 2009   endometrial adenocarcinoma  . Diverticulosis    citrucel  . Diverticulosis   . Fibroid 1998   fundal 1.5 cm  . Gallstone   . Hypertension   . Osteoarthritis of hand    --right  . Pancreatitis   . PID (pelvic inflammatory disease) 1986   laparoscopy    Past Surgical History:  Procedure Laterality Date  . ABDOMINAL HYSTERECTOMY  05/26/07   TAH/BSO- Grade 1 endometrial ad  . BLEPHAROPLASTY  09/1998  . CARPAL TUNNEL RELEASE Right 2004   Dr. Burney Gauze  . CHOLECYSTECTOMY, LAPAROSCOPIC  11/2013   --Dr. Owens Loffler [facility]  . PLANTAR FASCIA RELEASE Right   . TOOTH EXTRACTION       Current Outpatient Prescriptions  Medication Sig Dispense Refill  . albuterol (PROVENTIL HFA;VENTOLIN HFA) 108 (90 Base) MCG/ACT inhaler Inhale into the lungs as needed.    . ALPRAZolam (XANAX) 0.5 MG tablet Take 0.5 mg by mouth 3 (three) times daily as needed for anxiety. Patient takes when travels    . aspirin 81 MG tablet Take 81 mg by mouth daily.    . budesonide (PULMICORT) 180 MCG/ACT inhaler Inhale 2 puffs into the lungs as needed.    . calcium citrate (CALCITRATE - DOSED IN MG ELEMENTAL CALCIUM) 950 MG tablet Take 1,000 mg by mouth daily.    Marland Kitchen co-enzyme Q-10 50 MG capsule Take 300 mg by mouth daily.    . fluticasone (FLONASE) 50 MCG/ACT nasal spray Place into the nose.    . hydrochlorothiazide (MICROZIDE) 12.5 MG capsule Take 12.5 mg by mouth daily.    . Omega-3 Fatty Acids (FISH OIL PO) Take by mouth.    . ramipril (ALTACE) 5 MG capsule Take 5 mg by mouth daily.   3   No current facility-administered medications for this visit.     Family History  Problem Relation Age of Onset  . Diabetes Mother   . Hypertension Mother   . Stroke Mother   . Hypertension Father   .  Hypertension Sister   . Thyroid disease Sister     ROS:  Pertinent items are noted in HPI.  Otherwise, a comprehensive ROS was negative.  Exam:   BP 120/72 (BP Location: Right Arm, Patient Position: Sitting, Cuff Size: Large)   Pulse 76   Resp 16   Ht 5\' 4"  (1.626 m)   Wt 194 lb (88 kg)   LMP 05/26/2007   BMI 33.30 kg/m     General appearance: alert, cooperative and appears stated age Head: Normocephalic, without obvious abnormality, atraumatic Neck: no adenopathy, supple, symmetrical, trachea midline and thyroid normal to inspection and palpation Lungs: clear to auscultation bilaterally Breasts: normal appearance, no masses or tenderness, No nipple retraction or dimpling, No nipple discharge or bleeding, No axillary or supraclavicular adenopathy Heart: regular rate and rhythm Abdomen: soft,  non-tender; no masses, no organomegaly Extremities: extremities normal, atraumatic, no cyanosis or edema Skin: Skin color, texture, turgor normal. No rashes or lesions Lymph nodes: Cervical, supraclavicular, and axillary nodes normal. No abnormal inguinal nodes palpated Neurologic: Grossly normal  Pelvic: External genitalia:  no lesions              Urethra:  normal appearing urethra with no masses, tenderness or lesions              Bartholins and Skenes: normal                 Vagina: normal appearing vagina with normal color and discharge, no lesions              Cervix:  absent              Pap taken: Yes.   Bimanual Exam:  Uterus:  normal size, contour, position, consistency, mobility, non-tender              Adnexa: no mass, fullness, tenderness              Rectal exam: Yes.  .  Confirms.              Anus:  normal sphincter tone, no lesions  Chaperone was present for exam.  Assessment:   Well woman visit with normal exam. Status post TAH/BSO for endometrial CA.  No evidence of recurrence.  Vaginal atrophy.  Plan: Mammogram screening discussed. Recommended self breast awareness. Pap and HR HPV as above. Guidelines for Calcium, Vitamin D, regular exercise program including cardiovascular and weight bearing exercise. Discussed cooking oils, vit D, vaginal estrogen, and Josph Macho for atrophy.  She will try cooking oil on a tampon. Follow up annually and prn.    After visit summary provided.

## 2016-10-21 LAB — CYTOLOGY - PAP: Diagnosis: NEGATIVE

## 2016-12-16 ENCOUNTER — Other Ambulatory Visit: Payer: Self-pay | Admitting: Podiatry

## 2016-12-16 ENCOUNTER — Ambulatory Visit (INDEPENDENT_AMBULATORY_CARE_PROVIDER_SITE_OTHER): Payer: Medicare Other

## 2016-12-16 ENCOUNTER — Encounter: Payer: Self-pay | Admitting: Podiatry

## 2016-12-16 ENCOUNTER — Ambulatory Visit (INDEPENDENT_AMBULATORY_CARE_PROVIDER_SITE_OTHER): Payer: Medicare Other | Admitting: Podiatry

## 2016-12-16 DIAGNOSIS — M79671 Pain in right foot: Secondary | ICD-10-CM | POA: Diagnosis not present

## 2016-12-16 DIAGNOSIS — M779 Enthesopathy, unspecified: Secondary | ICD-10-CM | POA: Diagnosis not present

## 2016-12-16 DIAGNOSIS — M79672 Pain in left foot: Secondary | ICD-10-CM

## 2016-12-16 MED ORDER — TRIAMCINOLONE ACETONIDE 10 MG/ML IJ SUSP
10.0000 mg | Freq: Once | INTRAMUSCULAR | Status: AC
  Administered 2016-12-16: 10 mg

## 2016-12-16 NOTE — Progress Notes (Signed)
   Subjective:    Patient ID: Valerie Wilson, female    DOB: [birth year], 66 y.o.   MRN: [id number]  HPI Chief Complaint  Patient presents with  . Foot Pain    B/L dorsal x6 months. Pt. stated," been hurting a lot, pain has not stoped'      Review of Systems  All other systems reviewed and are negative.      Objective:   Physical Exam        Assessment & Plan:

## 2016-12-16 NOTE — Progress Notes (Signed)
Subjective:    Patient ID: Valerie Wilson, female   DOB: 66 y.o.   MRN: [id number]   HPI patient presents with a lot of pain on top of both feet and states that she's also tried over-the-counter insoles which helped her some and she wears good support shoes. Patient likes to be active does not smoke and is had reduction of activity levels due to pain    Review of Systems  All other systems reviewed and are negative.       Objective:  Physical Exam  Constitutional: She appears well-developed and well-nourished.  Cardiovascular: Intact distal pulses.   Musculoskeletal: Normal range of motion.  Neurological: She is alert.  Skin: Skin is warm.  Nursing note and vitals reviewed.  neurovascular status found to be intact with muscle strength adequate range of motion within normal limits. Patient is noted to have mild to moderate edema in the dorsum of both feet around the extensor complex midtarsal joint with inflammation fluid and pain when palpated. Patient's found have good digital perfusion and is well oriented 3     Assessment:  Dorsal tendinitis bilateral       Plan:    H&P condition reviewed and injected the dorsal tendon complex bilateral 3 mg Kenalog 5 mg Xylocaine advised on heat therapy wider shoes and the fact we may need orthotics long-term. We will see the response to this and decide what else may be appropriate  X-rays indicate there is some enlargement around the midtarsal joint with a spur formation left over right

## 2017-04-15 ENCOUNTER — Other Ambulatory Visit: Payer: Self-pay | Admitting: Dentistry

## 2017-04-15 DIAGNOSIS — M2669 Other specified disorders of temporomandibular joint: Secondary | ICD-10-CM

## 2017-04-15 DIAGNOSIS — M2629 Other anomalies of dental arch relationship: Secondary | ICD-10-CM

## 2017-04-15 DIAGNOSIS — H9201 Otalgia, right ear: Secondary | ICD-10-CM

## 2017-04-17 ENCOUNTER — Ambulatory Visit
Admission: RE | Admit: 2017-04-17 | Discharge: 2017-04-17 | Disposition: A | Discharge status: Home / Self Care | Payer: Medicare Other | Source: Ambulatory Visit | Attending: Dentistry | Admitting: Dentistry

## 2017-04-17 DIAGNOSIS — H9201 Otalgia, right ear: Secondary | ICD-10-CM

## 2017-04-17 DIAGNOSIS — M2629 Other anomalies of dental arch relationship: Secondary | ICD-10-CM

## 2017-04-17 DIAGNOSIS — M2669 Other specified disorders of temporomandibular joint: Secondary | ICD-10-CM

## 2017-07-18 ENCOUNTER — Ambulatory Visit (INDEPENDENT_AMBULATORY_CARE_PROVIDER_SITE_OTHER): Payer: Medicare Other | Admitting: Podiatry

## 2017-07-18 ENCOUNTER — Encounter: Payer: Self-pay | Admitting: Podiatry

## 2017-07-18 ENCOUNTER — Ambulatory Visit (INDEPENDENT_AMBULATORY_CARE_PROVIDER_SITE_OTHER): Payer: Medicare Other

## 2017-07-18 DIAGNOSIS — M7662 Achilles tendinitis, left leg: Secondary | ICD-10-CM

## 2017-07-18 DIAGNOSIS — M7661 Achilles tendinitis, right leg: Secondary | ICD-10-CM

## 2017-07-18 MED ORDER — TRIAMCINOLONE ACETONIDE 10 MG/ML IJ SUSP
10.0000 mg | Freq: Once | INTRAMUSCULAR | Status: AC
  Administered 2017-07-18: 10 mg

## 2017-07-21 NOTE — Progress Notes (Signed)
Subjective:   Patient ID: Valerie Wilson, female   DOB: 67 y.o.   MRN: [id number]   HPI Patient presents stating she started to develop pain on the top of both feet recently and she is done very well with the regimen that we have taken with her   ROS      Objective:  Physical Exam  Neurovascular status intact with dorsal tendinitis bilateral occurring in the tendon extensor complex of both feet     Assessment:  Dorsal tendinitis bilateral     Plan:  H&P condition reviewed and today I injected the dorsal tendon complex 3 mg Kenalog 5 mg Xylocaine and advised on heat ice therapy and tying her shoes looser.  Reappoint as symptoms indicate

## 2017-10-17 ENCOUNTER — Encounter: Payer: Self-pay | Admitting: Emergency Medicine

## 2017-10-21 ENCOUNTER — Encounter: Payer: Self-pay | Admitting: Obstetrics and Gynecology

## 2017-10-23 ENCOUNTER — Ambulatory Visit: Payer: Medicare Other | Admitting: Obstetrics and Gynecology

## 2017-10-31 ENCOUNTER — Encounter: Payer: Self-pay | Admitting: Obstetrics and Gynecology

## 2017-10-31 ENCOUNTER — Other Ambulatory Visit: Payer: Self-pay

## 2017-10-31 ENCOUNTER — Ambulatory Visit: Payer: Medicare Other | Admitting: Obstetrics and Gynecology

## 2017-10-31 ENCOUNTER — Other Ambulatory Visit (HOSPITAL_COMMUNITY)
Admission: RE | Admit: 2017-10-31 | Discharge: 2017-10-31 | Disposition: A | Discharge status: Home / Self Care | Payer: Medicare Other | Source: Ambulatory Visit | Attending: Obstetrics and Gynecology | Admitting: Obstetrics and Gynecology

## 2017-10-31 VITALS — BP 130/78 | HR 64 | Resp 16 | Ht 64.5 in | Wt 198.0 lb

## 2017-10-31 DIAGNOSIS — Z01419 Encounter for gynecological examination (general) (routine) without abnormal findings: Secondary | ICD-10-CM

## 2017-10-31 DIAGNOSIS — Z8542 Personal history of malignant neoplasm of other parts of uterus: Secondary | ICD-10-CM | POA: Insufficient documentation

## 2017-10-31 DIAGNOSIS — Z08 Encounter for follow-up examination after completed treatment for malignant neoplasm: Secondary | ICD-10-CM | POA: Insufficient documentation

## 2017-10-31 MED ORDER — CLINDAMYCIN PHOSPHATE 1 % EX SOLN: Freq: Two times a day (BID) | CUTANEOUS | 2 refills | Status: DC

## 2017-10-31 NOTE — Patient Instructions (Signed)

## 2017-10-31 NOTE — Progress Notes (Signed)
67 y.o. G0P0000 Married Caucasian female here for annual exam.    States she is overweight, but this is stable for her. Lost 30 pounds with [employer].   No vaginal bleeding, pelvic pain, bladder or bowel function problems.   Does have boils in her vulvar hair. Has them chronically every few months.  Does not get them under her arm.   Struggling with bursitis.  Doing PT and prednisone.   Went on a cruise from [place] to [place].  Going to [employer] for one month.  Husband thinking about retirement.   PCP:  Dr. Kathryne Eriksson   Patient's last menstrual period was 05/26/2007.           Sexually active: Yes.    The current method of family planning is status post hysterectomy.    Exercising: No.  The patient does not participate in regular exercise at present. Smoker:  no  Health Maintenance: Pap:  10/17/16 Negative History of abnormal Pap:  Yes, years ago  MMG:  09/03/17 BIRADS 2 benign Colonoscopy:  03/2009 Normal with Dr. Olevia Perches f/u 10 years BMD:   04/09/13  Result  Normal TDaP:  2011 HIV: never Hep C: 01/01/16 Negative Screening Labs: PCP   reports that she has never smoked. She has never used smokeless tobacco. She reports that she drinks about 6.0 standard drinks of alcohol per week. She reports that she does not use drugs.  Past Medical History:  Diagnosis Date  . ASCUS on Pap smear 1994   x2 - colpo  . Cancer [facility]) 2009   endometrial adenocarcinoma  . Diverticulosis    citrucel  . Diverticulosis   . Fibroid 1998   fundal 1.5 cm  . Gallstone   . Hypertension   . Osteoarthritis of hand    --right  . Pancreatitis   . PID (pelvic inflammatory disease) 1986   laparoscopy    Past Surgical History:  Procedure Laterality Date  . ABDOMINAL HYSTERECTOMY  05/26/07   TAH/BSO- Grade 1 endometrial ad  . BLEPHAROPLASTY  09/1998  . CARPAL TUNNEL RELEASE Right 2004   Dr. Burney Gauze  . CHOLECYSTECTOMY, LAPAROSCOPIC  11/2013   --Dr. Owens Loffler [facility]  .  PLANTAR FASCIA RELEASE Right   . TOOTH EXTRACTION      Current Outpatient Medications  Medication Sig Dispense Refill  . albuterol (PROVENTIL HFA;VENTOLIN HFA) 108 (90 Base) MCG/ACT inhaler Inhale into the lungs as needed.    . ALPRAZolam (XANAX) 0.5 MG tablet Take 0.5 mg by mouth 3 (three) times daily as needed for anxiety. Patient takes when travels    . aspirin 81 MG tablet Take 81 mg by mouth daily.    . Aspirin-Calcium Carbonate 81-777 MG TABS Take by mouth.    . budesonide (PULMICORT) 180 MCG/ACT inhaler Inhale 2 puffs into the lungs as needed.    . calcium citrate (CALCITRATE - DOSED IN MG ELEMENTAL CALCIUM) 950 MG tablet Take 1,000 mg by mouth daily.    Marland Kitchen co-enzyme Q-10 50 MG capsule Take 300 mg by mouth daily.    . fluticasone (FLONASE) 50 MCG/ACT nasal spray Place into the nose.    . hydrochlorothiazide (MICROZIDE) 12.5 MG capsule Take 12.5 mg by mouth daily.    Marland Kitchen loratadine (CLARITIN) 10 MG tablet Take by mouth.    . Omega-3 Fatty Acids (FISH OIL PO) Take by mouth.    . ramipril (ALTACE) 5 MG capsule Take 5 mg by mouth daily.   3   No current facility-administered medications  for this visit.     Family History  Problem Relation Age of Onset  . Diabetes Mother   . Hypertension Mother   . Stroke Mother   . Hypertension Father   . Hypertension Sister   . Thyroid disease Sister     Review of Systems  HENT:       Sinusitis  Musculoskeletal:       Bursitis in hip  Hematological: Bruises/bleeds easily.  All other systems reviewed and are negative.   Exam:   BP 130/78 (BP Location: Right Arm, Patient Position: Sitting, Cuff Size: Normal)   Pulse 64   Resp 16   Ht 5' 4.5" (1.638 m)   Wt 198 lb (89.8 kg)   LMP 05/26/2007   BMI 33.46 kg/m     General appearance: alert, cooperative and appears stated age Head: Normocephalic, without obvious abnormality, atraumatic Neck: no adenopathy, supple, symmetrical, trachea midline and thyroid normal to inspection and  palpation Lungs: clear to auscultation bilaterally Breasts: normal appearance, no masses or tenderness, No nipple retraction or dimpling, No nipple discharge or bleeding, No axillary or supraclavicular adenopathy Heart: regular rate and rhythm Abdomen: soft, non-tender; no masses, no organomegaly Extremities: extremities normal, atraumatic, no cyanosis or edema Skin: Skin color, texture, turgor normal. No rashes or lesions Lymph nodes: Cervical, supraclavicular, and axillary nodes normal. No abnormal inguinal nodes palpated Neurologic: Grossly normal  Pelvic: External genitalia:  no lesions              Urethra:  normal appearing urethra with no masses, tenderness or lesions              Bartholins and Skenes: normal                 Vagina: normal appearing vagina with normal color and discharge, no lesions.  Mild atrophy.              Cervix: absent              Pap taken: Yes.   Bimanual Exam:  Uterus:   absent              Adnexa: no mass, fullness, tenderness              Rectal exam: Yes.  .  Confirms.              Anus:  normal sphincter tone, no lesions  Chaperone was present for exam.  Assessment:   Well woman visit with normal exam. Status post TAH/BSO for endometrial CA.  No evidence of recurrence.  Vaginal atrophy. Obesity.   Plan: Mammogram screening. Recommended self breast awareness. Pap and HR HPV as above. Guidelines for Calcium, Vitamin D, regular exercise program including cardiovascular and weight bearing exercise. I discussed weight loss through the [facility].   Follow up annually and prn.   After visit summary provided.

## 2017-11-03 LAB — CYTOLOGY - PAP: DIAGNOSIS: NEGATIVE

## 2018-02-24 LAB — LAB REPORT - SCANNED

## 2018-11-16 ENCOUNTER — Other Ambulatory Visit: Payer: Self-pay

## 2018-11-16 NOTE — Progress Notes (Signed)
68 y.o. G45P0000 Married Caucasian female here for annual exam.    Denies vaginal bleeding, pelvic pain or pressure, no bladder or bowel problems.  States her stress incontinence is better with weight loss.   Started doing [employer] and lost 20 pounds.  States her attitude is better and her energy as well.  Wants to loose 10 more pounds.   Doing ok during the pandemic.   PCP:  Kathryne Eriksson, MD   Patient's last menstrual period was 05/26/2007.           Sexually active: Yes.    The current method of family planning is status post hysterectomy.    Exercising: Yes.    walking Smoker:  no  Health Maintenance: Pap: 10-31-17 Neg, 10-17-16 Neg History of abnormal Pap:  Yes, years ago MMG: 09-03-17 Neg/density B/BiRads2--appt. 11-19-18 Colonoscopy:  03/2009 Normal;next 10 years  BMD:   04-09-13 Result  normal TDaP: 2011 Gardasil:   n/a HIV: no Hep C:01-01-16 Neg Screening Labs:   PCP.   reports that she has never smoked. She has never used smokeless tobacco. She reports current alcohol use of about 2.0 - 3.0 standard drinks of alcohol per week. She reports that she does not use drugs.  Past Medical History:  Diagnosis Date  . ASCUS on Pap smear 1994   x2 - colpo  . Cancer [facility]) 2009   endometrial adenocarcinoma  . Diverticulosis    citrucel  . Diverticulosis   . Fibroid 1998   fundal 1.5 cm  . Gallstone   . Hypertension   . Osteoarthritis of hand    --right  . Pancreatitis   . PID (pelvic inflammatory disease) 1986   laparoscopy    Past Surgical History:  Procedure Laterality Date  . ABDOMINAL HYSTERECTOMY  05/26/07   TAH/BSO- Grade 1 endometrial ad  . BLEPHAROPLASTY  09/1998  . CARPAL TUNNEL RELEASE Right 2004   Dr. Burney Gauze  . CHOLECYSTECTOMY, LAPAROSCOPIC  11/2013   --Dr. Owens Loffler [facility]  . PLANTAR FASCIA RELEASE Right   . TOOTH EXTRACTION      Current Outpatient Medications  Medication Sig Dispense Refill  . albuterol (PROVENTIL HFA;VENTOLIN HFA) 108  (90 Base) MCG/ACT inhaler Inhale into the lungs as needed.    . ALPRAZolam (XANAX) 0.5 MG tablet Take 0.5 mg by mouth 3 (three) times daily as needed for anxiety. Patient takes when travels    . aspirin 81 MG tablet Take 81 mg by mouth daily.    . calcium citrate (CALCITRATE - DOSED IN MG ELEMENTAL CALCIUM) 950 MG tablet Take 1,000 mg by mouth daily.    . clindamycin (CLEOCIN-T) 1 % external solution Apply topically 2 (two) times daily. 30 mL 2  . co-enzyme Q-10 50 MG capsule Take 300 mg by mouth daily.    . diclofenac (VOLTAREN) 75 MG EC tablet Take 1 tablet by mouth as needed.    . fluticasone (FLONASE) 50 MCG/ACT nasal spray Place into the nose.    Marland Kitchen Fluticasone-Salmeterol (ADVAIR) 250-50 MCG/DOSE AEPB Inhale 1 puff into the lungs daily.    . hydrochlorothiazide (MICROZIDE) 12.5 MG capsule Take 12.5 mg by mouth daily.    Marland Kitchen loratadine (CLARITIN) 10 MG tablet Take by mouth.    . Omega-3 Fatty Acids (FISH OIL PO) Take by mouth.    Marland Kitchen OVER THE COUNTER MEDICATION Serrapeptase--Takes daily    . ramipril (ALTACE) 10 MG capsule Take 1 capsule by mouth daily.     No current facility-administered medications for this  visit.     Family History  Problem Relation Age of Onset  . Diabetes Mother   . Hypertension Mother   . Stroke Mother   . Hypertension Father   . Hypertension Sister   . Thyroid disease Sister     Review of Systems  All other systems reviewed and are negative.   Exam:   BP 130/84 (Cuff Size: Large)   Pulse 64   Temp (!) 97.3 F (36.3 C) (Temporal)   Resp 16   Ht 5' 4.25" (1.632 m)   Wt 181 lb 3.2 oz (82.2 kg)   LMP 05/26/2007   BMI 30.86 kg/m     General appearance: alert, cooperative and appears stated age Head: normocephalic, without obvious abnormality, atraumatic Neck: no adenopathy, supple, symmetrical, trachea midline and thyroid normal to inspection and palpation Lungs: clear to auscultation bilaterally Breasts: normal appearance, no masses or tenderness, No  nipple retraction or dimpling, No nipple discharge or bleeding, No axillary adenopathy Heart: regular rate and rhythm Abdomen: soft, non-tender; no masses, no organomegaly Extremities: extremities normal, atraumatic, no cyanosis or edema Skin: skin color, texture, turgor normal. No rashes or lesions Lymph nodes: cervical, supraclavicular, and axillary nodes normal. Neurologic: grossly normal  Pelvic: External genitalia:  no lesions              No abnormal inguinal nodes palpated.              Urethra:  normal appearing urethra with no masses, tenderness or lesions              Bartholins and Skenes: normal                 Vagina: normal appearing vagina with normal color and discharge, no lesions              Cervix: absent              Pap taken: No. Bimanual Exam:  Uterus:   Absent.              Adnexa: no mass, fullness, tenderness              Rectal exam: Yes.  .  Confirms.              Anus:  normal sphincter tone, no lesions  Chaperone was present for exam.  Assessment:   Well woman visit with normal exam. Status post TAH/BSO for endometrial CA.  FIGO IB. No evidence of recurrence.  Obesity.  Doing successful weight loss.   Plan: Mammogram screening discussed. Self breast awareness reviewed. Pap and HR HPV as above. Guidelines for Calcium, Vitamin D, regular exercise program including cardiovascular and weight bearing exercise. We reviewed protocol for post endometrial cancer surveillance in Up to Date.  No pap today.  Flu vaccine recommended.  Labs with PCP at the end of the year.  Doing the Covid vaccine trial.  [employer] is doing this.  Follow up annually and prn.   After visit summary provided.

## 2018-11-18 ENCOUNTER — Ambulatory Visit (INDEPENDENT_AMBULATORY_CARE_PROVIDER_SITE_OTHER): Payer: Medicare Other | Admitting: Obstetrics and Gynecology

## 2018-11-18 ENCOUNTER — Encounter: Payer: Self-pay | Admitting: Obstetrics and Gynecology

## 2018-11-18 ENCOUNTER — Other Ambulatory Visit: Payer: Self-pay

## 2018-11-18 VITALS — BP 130/84 | HR 64 | Temp 97.3°F | Resp 16 | Ht 64.25 in | Wt 181.2 lb

## 2018-11-18 DIAGNOSIS — Z01419 Encounter for gynecological examination (general) (routine) without abnormal findings: Secondary | ICD-10-CM

## 2018-11-18 DIAGNOSIS — Z124 Encounter for screening for malignant neoplasm of cervix: Secondary | ICD-10-CM | POA: Diagnosis not present

## 2018-11-18 NOTE — Patient Instructions (Signed)

## 2018-11-19 ENCOUNTER — Encounter: Payer: Self-pay | Admitting: Obstetrics and Gynecology

## 2018-12-02 ENCOUNTER — Encounter: Payer: Self-pay | Admitting: Obstetrics and Gynecology

## 2018-12-18 ENCOUNTER — Telehealth: Payer: Self-pay | Admitting: Obstetrics and Gynecology

## 2018-12-18 NOTE — Telephone Encounter (Signed)
Please contact patient to inform her of her normal BMD from 12/02/18 at [facility].

## 2018-12-22 NOTE — Telephone Encounter (Signed)
Spoke with patient and notified of normal BMD .

## 2019-04-29 ENCOUNTER — Encounter: Payer: Self-pay | Admitting: Gastroenterology

## 2019-06-02 ENCOUNTER — Ambulatory Visit (AMBULATORY_SURGERY_CENTER): Payer: Self-pay | Admitting: *Deleted

## 2019-06-02 ENCOUNTER — Other Ambulatory Visit: Payer: Self-pay

## 2019-06-02 VITALS — Temp 97.8°F | Ht 64.0 in | Wt 187.0 lb

## 2019-06-02 DIAGNOSIS — Z1211 Encounter for screening for malignant neoplasm of colon: Secondary | ICD-10-CM

## 2019-06-02 NOTE — Progress Notes (Signed)

## 2019-06-16 ENCOUNTER — Encounter: Payer: Self-pay | Admitting: Gastroenterology

## 2019-06-16 ENCOUNTER — Other Ambulatory Visit: Payer: Self-pay

## 2019-06-16 ENCOUNTER — Ambulatory Visit (AMBULATORY_SURGERY_CENTER): Payer: Medicare Other | Admitting: Gastroenterology

## 2019-06-16 VITALS — BP 139/61 | HR 84 | Temp 96.9°F | Resp 14 | Ht 64.0 in | Wt 187.0 lb

## 2019-06-16 DIAGNOSIS — Z1211 Encounter for screening for malignant neoplasm of colon: Secondary | ICD-10-CM

## 2019-06-16 DIAGNOSIS — D124 Benign neoplasm of descending colon: Secondary | ICD-10-CM | POA: Diagnosis not present

## 2019-06-16 MED ORDER — SODIUM CHLORIDE 0.9 % IV SOLN: 500.0000 mL | Freq: Once | INTRAVENOUS | Status: DC

## 2019-06-16 NOTE — Progress Notes (Signed)
Called to room to assist during endoscopic procedure.  Patient ID and intended procedure confirmed with present staff. Received instructions for my participation in the procedure from the performing physician.  

## 2019-06-16 NOTE — Progress Notes (Signed)
Pt's states no medical or surgical changes since previsit or office visit. 

## 2019-06-16 NOTE — Progress Notes (Signed)
PT taken to PACU. Monitors in place. VSS. Report given to RN. 

## 2019-06-16 NOTE — Patient Instructions (Signed)
Impression/Recommendations:  Polyp handout given to patient. Diverticulosis handout given to patient. High fiber diet handout given to patient.   Drink at least 64 oz. Of water daily.  Add a daily stool bulking agent such as psyllium (example:  Metamucil)  Continue present medications. Await pathology results.  YOU HAD AN ENDOSCOPIC PROCEDURE TODAY AT [facility]:   Refer to the procedure report that was given to you for any specific questions about what was found during the examination.  If the procedure report does not answer your questions, please call your gastroenterologist to clarify.  If you requested that your care partner not be given the details of your procedure findings, then the procedure report has been included in a sealed envelope for you to review at your convenience later.  YOU SHOULD EXPECT: Some feelings of bloating in the abdomen. Passage of more gas than usual.  Walking can help get rid of the air that was put into your GI tract during the procedure and reduce the bloating. If you had a lower endoscopy (such as a colonoscopy or flexible sigmoidoscopy) you may notice spotting of blood in your stool or on the toilet paper. If you underwent a bowel prep for your procedure, you may not have a normal bowel movement for a few days.  Please Note:  You might notice some irritation and congestion in your nose or some drainage.  This is from the oxygen used during your procedure.  There is no need for concern and it should clear up in a day or so.  SYMPTOMS TO REPORT IMMEDIATELY:   Following lower endoscopy (colonoscopy or flexible sigmoidoscopy):  Excessive amounts of blood in the stool  Significant tenderness or worsening of abdominal pains  Swelling of the abdomen that is new, acute  Fever of 100F or higher  For urgent or emergent issues, a gastroenterologist can be reached at any hour by calling [phone]. Do not use MyChart messaging for urgent  concerns.    DIET:  We do recommend a small meal at first, but then you may proceed to your regular diet.  Drink plenty of fluids but you should avoid alcoholic beverages for 24 hours.  ACTIVITY:  You should plan to take it easy for the rest of today and you should NOT DRIVE or use heavy machinery until tomorrow (because of the sedation medicines used during the test).    FOLLOW UP: Our staff will call the number listed on your records 48-72 hours following your procedure to check on you and address any questions or concerns that you may have regarding the information given to you following your procedure. If we do not reach you, we will leave a message.  We will attempt to reach you two times.  During this call, we will ask if you have developed any symptoms of COVID 19. If you develop any symptoms (ie: fever, flu-like symptoms, shortness of breath, cough etc.) before then, please call [phone].  If you test positive for Covid 19 in the 2 weeks post procedure, please call and report this information to [place].    If any biopsies were taken you will be contacted by phone or by letter within the next 1-3 weeks.  Please call us at [phone] if you have not heard about the biopsies in 3 weeks.    SIGNATURES/CONFIDENTIALITY: You and/or your care partner have signed paperwork which will be entered into your electronic medical record.  These signatures attest to the fact that  that the information above on your After Visit Summary has been reviewed and is understood.  Full responsibility of the confidentiality of this discharge information lies with you and/or your care-partner.

## 2019-06-16 NOTE — Op Note (Signed)
[place] Patient Name: Valerie Wilson Procedure Date: 06/16/2019 8:51 AM MRN: [id number] Endoscopist: Thornton Park MD, MD Age: 69 Referring MD:  Date of Birth: Mar 10, 1951 Gender: Female Account #: [id number] Procedure:                Colonoscopy Indications:              Screening for colorectal malignant neoplasm                           No known family history of colon cancer or polyps                           Prior colonoscopies with Dr. Olevia Perches 01/25/1999 and                            03/21/2009 Medicines:                Monitored Anesthesia Care Procedure:                Pre-Anesthesia Assessment:                           - Prior to the procedure, a History and Physical                            was performed, and patient medications and                            allergies were reviewed. The patient's tolerance of                            previous anesthesia was also reviewed. The risks                            and benefits of the procedure and the sedation                            options and risks were discussed with the patient.                            All questions were answered, and informed consent                            was obtained. Prior Anticoagulants: The patient has                            taken no previous anticoagulant or antiplatelet                            agents. ASA Grade Assessment: II - A patient with                            mild systemic disease. After reviewing the risks  and benefits, the patient was deemed in                            satisfactory condition to undergo the procedure.                           After obtaining informed consent, the colonoscope                            was passed under direct vision. Throughout the                            procedure, the patient's blood pressure, pulse, and                            oxygen saturations were monitored continuously. The                     Colonoscope was introduced through the anus and                            advanced to the 4 cm into the ileum. A second                            forward view of the right colon was performed. The                            colonoscopy was performed with difficulty due to                            multiple diverticula in the colon. Successful                            completion of the procedure was aided by changing                            the patient's position, water immersion of the left                            colon, withdrawing the scope and replacing with the                            pediatric endoscope and applying abdominal                            pressure. The patient tolerated the procedure well.                            The quality of the bowel preparation was good. The                            terminal ileum, ileocecal valve, appendiceal  orifice, and rectum were photographed. Scope In: 8:55:25 AM Scope Out: 9:23:21 AM Scope Withdrawal Time: 0 hours 15 minutes 14 seconds  Total Procedure Duration: 0 hours 27 minutes 56 seconds  Findings:                 The perianal and digital rectal examinations were                            normal.                           Multiple small and large-mouthed diverticula were                            found in the sigmoid colon and descending colon.                           Two sessile polyps were found in the proximal                            descending colon. The polyps were 1 to 2 mm in                            size. These polyps were removed with a cold snare.                            Resection and retrieval were complete. Estimated                            blood loss was minimal.                           The exam was otherwise without abnormality on                            direct and retroflexion views. Complications:            No immediate complications.  Estimated blood loss:                            Minimal. Estimated Blood Loss:     Estimated blood loss was minimal. Impression:               - Diverticulosis in the sigmoid colon and in the                            descending colon.                           - Two 1 to 2 mm polyps in the proximal descending                            colon, removed with a cold snare. Resected and                            retrieved.                           -  The examination was otherwise normal on direct                            and retroflexion views. Recommendation:           - Patient has a contact number available for                            emergencies. The signs and symptoms of potential                            delayed complications were discussed with the                            patient. Return to normal activities tomorrow.                            Written discharge instructions were provided to the                            patient.                           - Continue present medications.                           - Await pathology results.                           - Follow a high fiber diet. Drink at least 64                            ounces of water daily. Add a daily stool bulking                            agent such as psyllium (an exampled would be                            Metamucil).                           - Emerging evidence supports eating a diet of                            fruits, vegetables, grains, calcium, and yogurt                            while reducing red meat and alcohol may reduce the                            risk of colon cancer.                           - Thank you for allowing me to be involved in your  colon cancer prevention. Thornton Park MD, MD 06/16/2019 9:29:44 AM This report has been signed electronically.

## 2019-06-21 ENCOUNTER — Telehealth: Payer: Self-pay | Admitting: *Deleted

## 2019-06-21 ENCOUNTER — Encounter: Payer: Self-pay | Admitting: Gastroenterology

## 2019-06-21 ENCOUNTER — Telehealth: Payer: Self-pay

## 2019-06-21 NOTE — Telephone Encounter (Signed)
Second follow up call attempt, no answer, LM 

## 2019-06-21 NOTE — Telephone Encounter (Signed)
Attempted f/u phone call. No answer, left message.

## 2019-11-24 ENCOUNTER — Telehealth: Payer: Self-pay | Admitting: Podiatry

## 2019-11-24 NOTE — Telephone Encounter (Signed)
This pt hasn't not been seen in our office since 2019. I would say she would need to make an appointment in other to get and referral to the rheumatologist. Thanks

## 2019-11-24 NOTE — Telephone Encounter (Signed)
Pt called and asked for a referral to a rheumatologist.

## 2019-11-25 NOTE — Telephone Encounter (Signed)
I called the pt this morning and example this to her. The pt stated hat she is actually waiting to seek a new primary care providers. She would also like to know does she need to make a new appointment to get a referral from you?

## 2019-11-25 NOTE — Telephone Encounter (Signed)
She can see [place] or go thru her primary care for rheumatologist referral

## 2019-11-26 NOTE — Telephone Encounter (Signed)
She would need appointment after 2 years

## 2019-12-23 ENCOUNTER — Ambulatory Visit: Payer: Medicare Other | Admitting: Podiatry

## 2020-01-04 NOTE — Progress Notes (Signed)
69 y.o. G59P0000 Married Caucasian female here for annual exam.    Had webvisit 03/2019 with PCP to refill medications--not sure if AEX.  Denies vaginal bleeding.   Dealing with arthritis and bursitis.  Taking an anti-inflammatory diet.  Able to stop Ibuprofen Feels her brain fog is gone.  Lost 8 more pounds.   Found a tick on her skin last night and removed it.  Tick was not engorged.   Not using Clindamycin lotion for folliculitis.   Received her Covid booster.  Received her flu vaccine also.  PCP: Cristie Hem, MD--[facility]  Patient's last menstrual period was 05/26/2007.           Sexually active: Yes.    The current method of family planning is status post hysterectomy.    Exercising: No.  The patient does not participate in regular exercise at present. patient is active Smoker:  no  Health Maintenance: Pap:  10-31-17 Neg, 10-17-16 Neg, 09-14-14 Neg History of abnormal Pap:  Yes, years ago--ASCUS on pap x2--colpo MMG: 12/2019 will call for report--[facility] Colonoscopy: 06-16-19 tubular adenoma polyps;next due 7 years BMD: 12-02-18  Result :Normal TDaP: 04-12-13 Gardasil:   no HIV:no Hep C:01-01-16 Neg Screening Labs:  PCP.    reports that she has never smoked. She has never used smokeless tobacco. She reports current alcohol use of about 2.0 - 3.0 standard drinks of alcohol per week. She reports that she does not use drugs.  Past Medical History:  Diagnosis Date  . ASCUS on Pap smear 1994   x2 - colpo  . Cancer [facility]) 2009   endometrial adenocarcinoma  . Diverticulosis    citrucel  . Diverticulosis   . Fibroid 1998   fundal 1.5 cm  . Gallstone   . Hypertension   . Osteoarthritis of hand    --right  . Pancreatitis   . PID (pelvic inflammatory disease) 1986   laparoscopy    Past Surgical History:  Procedure Laterality Date  . ABDOMINAL HYSTERECTOMY  05/26/07   TAH/BSO- Grade 1 endometrial ad  . BLEPHAROPLASTY  09/1998  . CARPAL TUNNEL RELEASE  Right 2004   Dr. Burney Gauze  . CHOLECYSTECTOMY    . CHOLECYSTECTOMY, LAPAROSCOPIC  11/2013   --Dr. Owens Loffler [facility]  . COLONOSCOPY    . NASAL SEPTUM SURGERY    . PLANTAR FASCIA RELEASE Right   . TOOTH EXTRACTION      Current Outpatient Medications  Medication Sig Dispense Refill  . albuterol (PROVENTIL HFA;VENTOLIN HFA) 108 (90 Base) MCG/ACT inhaler Inhale into the lungs as needed.    . ALPRAZolam (XANAX) 0.5 MG tablet Take 0.5 mg by mouth 3 (three) times daily as needed for anxiety. Patient takes when travels    . aspirin 81 MG tablet Take 81 mg by mouth daily.    . clindamycin (CLEOCIN-T) 1 % external solution Apply topically 2 (two) times daily. 30 mL 2  . fluticasone (FLONASE) 50 MCG/ACT nasal spray Place into the nose.    Marland Kitchen Fluticasone-Salmeterol (ADVAIR) 250-50 MCG/DOSE AEPB Inhale 1 puff into the lungs daily.    Marland Kitchen guaiFENesin (MUCINEX) 600 MG 12 hr tablet Take 600 mg by mouth daily as needed.    . hydrochlorothiazide (HYDRODIURIL) 25 MG tablet Take 25 mg by mouth daily.    Marland Kitchen loratadine (CLARITIN) 10 MG tablet Take by mouth.    . Omega-3 Fatty Acids (FISH OIL PO) Take by mouth.    . ramipril (ALTACE) 10 MG capsule Take 1 capsule by mouth daily.    Marland Kitchen  triamcinolone (KENALOG) 0.1 % paste SMARTSIG:TO TEETH 5 Times Daily     No current facility-administered medications for this visit.    Family History  Problem Relation Age of Onset  . Diabetes Mother   . Hypertension Mother   . Stroke Mother   . Hypertension Father   . Hypertension Sister   . Thyroid disease Sister   . Esophageal cancer Neg Hx   . Colon cancer Neg Hx   . Rectal cancer Neg Hx   . Stomach cancer Neg Hx     Review of Systems  All other systems reviewed and are negative.   Exam:   BP (!) 142/84   Pulse 72   Ht 5\' 4"  (1.626 m)   Wt 176 lb (79.8 kg)   LMP 05/26/2007   SpO2 98%   BMI 30.21 kg/m     General appearance: alert, cooperative and appears stated age Head: normocephalic, without  obvious abnormality, atraumatic Neck: no adenopathy, supple, symmetrical, trachea midline and thyroid normal to inspection and palpation Lungs: clear to auscultation bilaterally Breasts: normal appearance, no masses or tenderness, No nipple retraction or dimpling, No nipple discharge or bleeding, No axillary adenopathy Heart: regular rate and rhythm Abdomen: soft, non-tender; no masses, no organomegaly Extremities: extremities normal, atraumatic, no cyanosis or edema Skin: skin color, texture, turgor normal. No rashes.  2 mm area of erythema right superior medial thigh. Lymph nodes: cervical, supraclavicular, and axillary nodes normal. Neurologic: grossly normal  Pelvic: External genitalia:  no lesions              No abnormal inguinal nodes palpated.              Urethra:  normal appearing urethra with no masses, tenderness or lesions              Bartholins and Skenes: normal                 Vagina: normal appearing vagina with normal color and discharge, no lesions, atrophy noted.              Cervix: absent              Pap taken: Yes.   Bimanual Exam:  Uterus:  absent              Adnexa: no mass, fullness, tenderness              Rectal exam: Yes.  .  Confirms.              Anus:  normal sphincter tone, no lesions  Chaperone was present for exam.  Assessment:   Well woman visit with normal exam. Status post TAH/BSO for endometrial CA.  FIGO IB. No evidence of recurrence.  Recent tick bite.   Plan: Mammogram screening discussed. Self breast awareness reviewed. Pap and HR HPV as above. Guidelines for Calcium, Vitamin D, regular exercise program including cardiovascular and weight bearing exercise. No doxycycline prophylaxis needed.  She will call her PCP if she has signs of tick born infection.  We reviewed signs and symptoms.  Follow up annually and prn.

## 2020-01-05 ENCOUNTER — Other Ambulatory Visit: Payer: Self-pay

## 2020-01-05 ENCOUNTER — Encounter: Payer: Self-pay | Admitting: Obstetrics and Gynecology

## 2020-01-05 ENCOUNTER — Ambulatory Visit (INDEPENDENT_AMBULATORY_CARE_PROVIDER_SITE_OTHER): Payer: Medicare Other | Admitting: Obstetrics and Gynecology

## 2020-01-05 ENCOUNTER — Other Ambulatory Visit (HOSPITAL_COMMUNITY)
Admission: RE | Admit: 2020-01-05 | Discharge: 2020-01-05 | Disposition: A | Discharge status: Home / Self Care | Payer: Medicare Other | Source: Ambulatory Visit | Attending: Obstetrics and Gynecology | Admitting: Obstetrics and Gynecology

## 2020-01-05 VITALS — BP 142/84 | HR 72 | Ht 64.0 in | Wt 176.0 lb

## 2020-01-05 DIAGNOSIS — Z1151 Encounter for screening for human papillomavirus (HPV): Secondary | ICD-10-CM | POA: Insufficient documentation

## 2020-01-05 DIAGNOSIS — Z01419 Encounter for gynecological examination (general) (routine) without abnormal findings: Secondary | ICD-10-CM | POA: Insufficient documentation

## 2020-01-05 DIAGNOSIS — R87610 Atypical squamous cells of undetermined significance on cytologic smear of cervix (ASC-US): Secondary | ICD-10-CM | POA: Insufficient documentation

## 2020-01-05 DIAGNOSIS — Z8542 Personal history of malignant neoplasm of other parts of uterus: Secondary | ICD-10-CM

## 2020-01-05 NOTE — Patient Instructions (Signed)

## 2020-01-07 LAB — CYTOLOGY - PAP
Comment: NEGATIVE
Diagnosis: UNDETERMINED — AB
High risk HPV: NEGATIVE

## 2020-01-11 ENCOUNTER — Telehealth: Payer: Self-pay

## 2020-01-11 DIAGNOSIS — R87610 Atypical squamous cells of undetermined significance on cytologic smear of cervix (ASC-US): Secondary | ICD-10-CM

## 2020-01-11 DIAGNOSIS — Z8542 Personal history of malignant neoplasm of other parts of uterus: Secondary | ICD-10-CM

## 2020-01-11 NOTE — Telephone Encounter (Signed)
Left message for pt to return call to triage RN. 

## 2020-01-11 NOTE — Telephone Encounter (Signed)
-----   Message from Nunzio Cobbs, MD sent at 01/11/2020  1:22 PM EDT ----- Please contact patient in follow up to her pap which showed atypia of the cells.  Her HPV test is negative for high risk HPV.  I am recommending colposcopy and biopsy with me in the office.  This will need precert and scheduling.  Patient has a history of endometrial cancer and is status post hysterectomy many years ago.  I suspect the changes noted on her pap are due to atrophy, which is thinning of the tissues from menopause.

## 2020-01-13 NOTE — Telephone Encounter (Signed)
Left message on home number, called mobile number.   Spoke with pt. Pt given results and recommendations per Dr Quincy Simmonds. Pt verbalized understanding and agreeable to have colpo and bx. Pt scheduled for 11/11 at 1130 am. Pt agreeable to date and time of appt.  Cc: Hayley for precert  Orders placed  Encounter closed.

## 2020-01-14 ENCOUNTER — Telehealth: Payer: Self-pay

## 2020-01-14 NOTE — Telephone Encounter (Signed)
Call to patient. Per DPR, OK to leave message on voicemail.   Left voicemail requesting a return call to [facility] to review benefits for scheduled Colposcopy with Brook A. Quincy Simmonds, MD, Cherlynn June

## 2020-01-17 NOTE — Telephone Encounter (Signed)
Spoke with patient regarding benefits for scheduled Colposcopy. Patient acknowledges understanding of information presented. Encounter closed. 

## 2020-01-25 NOTE — Progress Notes (Signed)
  Subjective:     Patient ID: Valerie Wilson, female   DOB: 09/30/1950, 69 y.o.   MRN: [id number]  HPI Pap History:  01/05/20 ASCUS:Neg HR HPV   10-31-17 Neg    10-17-16 Neg   09-14-14 Neg  Status post hysterectomy with BSO for grade 1 endometrial cancer in 2009.  Has not had vaginal bleeding.   Review of Systems  Constitutional: Negative.   HENT: Negative.   Eyes: Negative.   Respiratory: Negative.   Cardiovascular: Negative.   Gastrointestinal: Negative.   Endocrine: Negative.   Genitourinary: Negative.   Musculoskeletal: Negative.   Skin: Negative.   Allergic/Immunologic: Negative.   Neurological: Negative.   Hematological: Negative.   Psychiatric/Behavioral: Negative.    LMP: 05/26/07 Contraception: Hysterectomy    Objective:   Physical Exam Genitourinary:     Colposcopy of vagina with biopsy. Consent for procedure.  3% acetic acid placed.  Significant atrophy of the vagina noted with bleeding from placement of speculum and touching with Q-Tip soaked in acetic acid.  White light and green light filter used.  No HPV changes noted.  No specific lesions noted.  Lugol's placed and decreased uptake at the bilateral apices.  Biopsy of left apex and right apex to pathology.  Saline used to rinse the vagina due to stinging.  Monsel's placed.  Minimal EBL.  No complications.      Assessment:     Hx grade 1 endometrial cancer.  Status post hysterectomy with BSO.  ASCUS pap and negative HR HPV. I suspect atrophy as the cause of th ASCUS pap.    Plan:     Fu biopsies.  Post colposcopy instructions to patient.  Anticipated pap in one year.

## 2020-01-27 ENCOUNTER — Ambulatory Visit (INDEPENDENT_AMBULATORY_CARE_PROVIDER_SITE_OTHER): Payer: Medicare Other | Admitting: Obstetrics and Gynecology

## 2020-01-27 ENCOUNTER — Other Ambulatory Visit: Payer: Self-pay

## 2020-01-27 ENCOUNTER — Other Ambulatory Visit (HOSPITAL_COMMUNITY)
Admission: RE | Admit: 2020-01-27 | Discharge: 2020-01-27 | Disposition: A | Discharge status: Home / Self Care | Payer: Medicare Other | Source: Ambulatory Visit | Attending: Obstetrics and Gynecology | Admitting: Obstetrics and Gynecology

## 2020-01-27 ENCOUNTER — Encounter: Payer: Self-pay | Admitting: Obstetrics and Gynecology

## 2020-01-27 DIAGNOSIS — Z8542 Personal history of malignant neoplasm of other parts of uterus: Secondary | ICD-10-CM | POA: Insufficient documentation

## 2020-01-27 DIAGNOSIS — R87610 Atypical squamous cells of undetermined significance on cytologic smear of cervix (ASC-US): Secondary | ICD-10-CM | POA: Diagnosis present

## 2020-01-27 NOTE — Patient Instructions (Signed)
Colposcopy, Care After This sheet gives you information about how to care for yourself after your procedure. Your doctor may also give you more specific instructions. If you have problems or questions, contact your doctor. What can I expect after the procedure? If you did not have a tissue sample removed (did not have a biopsy), you may only have some spotting for a few days. You can go back to your normal activities. If you had a tissue sample removed, it is common to have:  Soreness and pain. This may last for a few days.  Light-headedness.  Mild bleeding from your vagina or dark-colored, grainy discharge from your vagina. This may last for a few days. You may need to wear a sanitary pad.  Spotting for at least 48 hours after the procedure. Follow these instructions at home:   Take over-the-counter and prescription medicines only as told by your doctor. Ask your doctor what medicines you can start taking again. This is very important if you take blood-thinning medicine.  Do not drive or use heavy machinery while taking prescription pain medicine.  For 3 days, or as long as your doctor tells you, avoid: ? Douching. ? Using tampons. ? Having sex.  If you use birth control (contraception), keep using it.  Limit activity for the first day after the procedure. Ask your doctor what activities are safe for you.  It is up to you to get the results of your procedure. Ask your doctor when your results will be ready.  Keep all follow-up visits as told by your doctor. This is important. Contact a doctor if:  You get a skin rash. Get help right away if:  You are bleeding a lot from your vagina. It is a lot of bleeding if you are using more than one pad an hour for 2 hours in a row.  You have clumps of blood (blood clots) coming from your vagina.  You have a fever.  You have chills  You have pain in your lower belly (pelvic area).  You have signs of infection, such as vaginal  discharge that is: ? Different than usual. ? Yellow. ? Bad-smelling.  You have very pain or cramps in your lower belly that do not get better with medicine.  You feel light-headed.  You feel dizzy.  You pass out (faint). Summary  If you did not have a tissue sample removed (did not have a biopsy), you may only have some spotting for a few days. You can go back to your normal activities.  If you had a tissue sample removed, it is common to have mild pain and spotting for 48 hours.  For 3 days, or as long as your doctor tells you, avoid douching, using tampons and having sex.  Get help right away if you have bleeding, very bad pain, or signs of infection. This information is not intended to replace advice given to you by your health care provider. Make sure you discuss any questions you have with your health care provider. Document Revised: 02/14/2017 Document Reviewed: 11/22/2015 Elsevier Patient Education  [place].

## 2020-01-31 LAB — SURGICAL PATHOLOGY

## 2020-02-02 ENCOUNTER — Telehealth: Payer: Self-pay | Admitting: *Deleted

## 2020-02-02 NOTE — Telephone Encounter (Signed)
Patient returned call. Message given to patient as seen below from Dr. Quincy Simmonds and patient verbalized understanding. 12 recall placed. Patient has aex scheduled for 01-09-21.   Encounter closed.

## 2020-02-02 NOTE — Telephone Encounter (Signed)
-----   Message from Nunzio Cobbs, MD sent at 02/02/2020  1:03 PM EST ----- Please contact patient with colposcopy biopsy results showing benign atrophy.  No abnormal cells were seen.   I recommend a pap in one year.  Please place 12 month recall.

## 2020-02-02 NOTE — Telephone Encounter (Signed)
Message left to return call to Sherion Dooly at [phone].    

## 2020-03-06 ENCOUNTER — Encounter: Payer: Self-pay | Admitting: Obstetrics and Gynecology

## 2020-11-15 ENCOUNTER — Other Ambulatory Visit (HOSPITAL_COMMUNITY): Payer: Self-pay | Admitting: Family Medicine

## 2020-11-15 DIAGNOSIS — R011 Cardiac murmur, unspecified: Secondary | ICD-10-CM

## 2020-12-14 ENCOUNTER — Other Ambulatory Visit: Payer: Self-pay

## 2020-12-14 ENCOUNTER — Ambulatory Visit (HOSPITAL_COMMUNITY): Payer: Medicare Other | Attending: Family Medicine

## 2020-12-14 DIAGNOSIS — R011 Cardiac murmur, unspecified: Secondary | ICD-10-CM | POA: Diagnosis present

## 2020-12-14 LAB — ECHOCARDIOGRAM COMPLETE
Area-P 1/2: 2.32 cm2
S' Lateral: 2.2 cm

## 2020-12-20 ENCOUNTER — Encounter: Payer: Self-pay | Admitting: Obstetrics and Gynecology

## 2021-01-08 NOTE — Progress Notes (Signed)
GYNECOLOGY  VISIT   HPI: 70 y.o.   Married  Caucasian  female   G0P0000 with Patient's last menstrual period was 05/26/2007.   here for breast and pelvic exam.    Vaginal dryness.  Denies vaginal bleeding, blood in urine, or blood in the stool.  Denies pelvic pain.   Had colposcopy last year due to pap showing atypia and neg HR HPV.  Biopsies showed atrophy and no sign of malignancy.  She declines a pap today.   She has a hx of endometrial cancer.  Declines pap today.   Has some sleep issues.  She is taking her husband's Ambien.  Stopped caffeine after 4:00 pm.  Walking more.  Does get up to empty her bladder at night, once.  Melatonin make her groggy.   Now has a mitral vale regurgitation murmur.   Also treating high cholesterol.   Traveling.   GYNECOLOGIC HISTORY: Patient's last menstrual period was 05/26/2007. Contraception: Hyst Menopausal hormone therapy: none Last mammogram: 12-18-20  3D/Neg/BiRads1 Last pap smear:  01-05-20 ASCUS:Neg HR HPV, 10-31-17 Neg, 10-17-16 Neg  BMD 12/02/18 - normal.         OB History     Gravida  0   Para  0   Term  0   Preterm  0   AB  0   Living  0      SAB  0   IAB  0   Ectopic  0   Multiple  0   Live Births                 Patient Active Problem List   Diagnosis Date Noted   Pancreatitis 11/30/2013    Class: History of   Endometrial cancer ([place]) 08/26/2012   ASTHMA 04/07/2007   ALLERGIC RHINITIS 04/06/2007    Past Medical History:  Diagnosis Date   ASCUS on Pap smear 03/18/1992   x2 - colpo   Cancer ([place]) 03/19/2007   endometrial adenocarcinoma   Diverticulosis    citrucel   Diverticulosis    Fibroid 03/18/1996   fundal 1.5 cm   Gallstone    Heart murmur    Hypertension    Osteoarthritis of hand    --right   Pancreatitis    PID (pelvic inflammatory disease) 03/18/1984   laparoscopy    Past Surgical History:  Procedure Laterality Date   ABDOMINAL HYSTERECTOMY  05/26/07   TAH/BSO-  Grade 1 endometrial ad   BLEPHAROPLASTY  09/1998   CARPAL TUNNEL RELEASE Right 2004   Dr. Burney Gauze   CHOLECYSTECTOMY     CHOLECYSTECTOMY, LAPAROSCOPIC  11/2013   --Dr. Owens Loffler [facility]   COLONOSCOPY     NASAL SEPTUM SURGERY     PLANTAR FASCIA RELEASE Right    TOOTH EXTRACTION      Current Outpatient Medications  Medication Sig Dispense Refill   albuterol (PROVENTIL HFA;VENTOLIN HFA) 108 (90 Base) MCG/ACT inhaler Inhale into the lungs as needed.     ALPRAZolam (XANAX) 0.5 MG tablet Take 0.5 mg by mouth 3 (three) times daily as needed for anxiety. Patient takes when travels     fluticasone (FLONASE) 50 MCG/ACT nasal spray Place into the nose.     Fluticasone-Salmeterol (ADVAIR) 250-50 MCG/DOSE AEPB Inhale 1 puff into the lungs daily.     guaiFENesin (MUCINEX) 600 MG 12 hr tablet Take 600 mg by mouth daily as needed.     hydrochlorothiazide (HYDRODIURIL) 25 MG tablet Take 25 mg by mouth daily.  loratadine (CLARITIN) 10 MG tablet Take by mouth.     Multiple Vitamin (MULTIVITAMIN) capsule Take 1 capsule by mouth daily.     ramipril (ALTACE) 10 MG capsule Take 1 capsule by mouth daily.     rosuvastatin (CRESTOR) 10 MG tablet      No current facility-administered medications for this visit.     ALLERGIES: Hydrocodone-acetaminophen  Family History  Problem Relation Age of Onset   Diabetes Mother    Hypertension Mother    Stroke Mother    Hypertension Father    Hypertension Sister    Thyroid disease Sister    Esophageal cancer Neg Hx    Colon cancer Neg Hx    Rectal cancer Neg Hx    Stomach cancer Neg Hx     Social History   Socioeconomic History   Marital status: Married    Spouse name: Not on file   Number of children: 2   Years of education: Not on file   Highest education level: Not on file  Occupational History   Occupation: Accountant  Tobacco Use   Smoking status: Never   Smokeless tobacco: Never  Vaping Use   Vaping Use: Never used  Substance and  Sexual Activity   Alcohol use: Yes    Comment: 1 glass of wine/month   Drug use: No   Sexual activity: Yes    Partners: Male    Birth control/protection: Surgical    Comment: Vasectomy/TAH/BSO  Other Topics Concern   Not on file  Social History Narrative   Not on file   Social Determinants of Health   Financial Resource Strain: Not on file  Food Insecurity: Not on file  Transportation Needs: Not on file  Physical Activity: Not on file  Stress: Not on file  Social Connections: Not on file  Intimate Partner Violence: Not on file    Review of Systems  All other systems reviewed and are negative.  PHYSICAL EXAMINATION:    BP 140/78   Pulse 66   Ht 5' 3.5" (1.613 m)   Wt 186 lb (84.4 kg)   LMP 05/26/2007   SpO2 98%   BMI 32.43 kg/m     General appearance: alert, cooperative and appears stated age Head: Normocephalic, without obvious abnormality, atraumatic Neck: no adenopathy, supple, symmetrical, trachea midline and thyroid normal to inspection and palpation Lungs: clear to auscultation bilaterally Breasts: normal appearance, no masses or tenderness, No nipple retraction or dimpling, No nipple discharge or bleeding, No axillary or supraclavicular adenopathy Heart: regular rate and rhythm Abdomen: soft, non-tender, no masses,  no organomegaly Extremities: extremities normal, atraumatic, no cyanosis or edema Skin: Skin color, texture, turgor normal. No rashes or lesions Lymph nodes: Cervical, supraclavicular, and axillary nodes normal. No abnormal inguinal nodes palpated Neurologic: Grossly normal  Pelvic: External genitalia:  no lesions              Urethra:  normal appearing urethra with no masses, tenderness or lesions              Bartholins and Skenes: normal                 Vagina: normal appearing vagina with normal color and discharge, no lesions.  Atrophy noted.               Cervix: absent                Bimanual Exam:  Uterus:  absent  Adnexa:  no mass, fullness, tenderness              Rectal exam: yes.  Confirms.              Anus:  normal sphincter tone, no lesions  Chaperone was present for exam:  Onalee Hua, CMA.  ASSESSMENT  Status post TAH/BSO for endometrial CA.  FIGO IB.  2009.  No evidence of recurrence.  Pap ASCUS with negative HR HPV 2021. Atrophy on vaginal biopsies.  Atrophy clinically.  Insomnia.   PLAN  Pap and HR HPV in 2 years.  We discussed water based lubricants, cooking oils, and vit E vaginal suppositories for vaginal atrophy.  3D mammogram yearly.  I encouraged self breast exams.  We reviewed good sleep hygiene, reasons for insomnia, and potential medications to treat this.  She will reach out to her PCP to discuss further.  FU in 2 years and prn.    An After Visit Summary was printed and given to the patient.  30 min  total time was spent for this patient encounter, including preparation, face-to-face counseling with the patient, coordination of care, and documentation of the encounter.

## 2021-01-09 ENCOUNTER — Telehealth: Payer: Self-pay | Admitting: Obstetrics and Gynecology

## 2021-01-09 ENCOUNTER — Encounter: Payer: Self-pay | Admitting: Obstetrics and Gynecology

## 2021-01-09 ENCOUNTER — Other Ambulatory Visit: Payer: Self-pay

## 2021-01-09 ENCOUNTER — Ambulatory Visit (INDEPENDENT_AMBULATORY_CARE_PROVIDER_SITE_OTHER): Payer: Medicare Other | Admitting: Obstetrics and Gynecology

## 2021-01-09 VITALS — BP 140/78 | HR 66 | Ht 63.5 in | Wt 186.0 lb

## 2021-01-09 DIAGNOSIS — Z8542 Personal history of malignant neoplasm of other parts of uterus: Secondary | ICD-10-CM

## 2021-01-09 DIAGNOSIS — N952 Postmenopausal atrophic vaginitis: Secondary | ICD-10-CM

## 2021-01-09 DIAGNOSIS — Z01419 Encounter for gynecological examination (general) (routine) without abnormal findings: Secondary | ICD-10-CM

## 2021-01-09 NOTE — Patient Instructions (Addendum)
Insomnia Insomnia is a sleep disorder that makes it difficult to fall asleep or stay asleep. Insomnia can cause fatigue, low energy, difficulty concentrating, mood swings, and poor performance at work or school. There are three different ways to classify insomnia: Difficulty falling asleep. Difficulty staying asleep. Waking up too early in the morning. Any type of insomnia can be long-term (chronic) or short-term (acute). Both are common. Short-term insomnia usually lasts for three months or less. Chronic insomnia occurs at least three times a week for longer than three months. What are the causes? Insomnia may be caused by another condition, situation, or substance, such as: Anxiety. Certain medicines. Gastroesophageal reflux disease (GERD) or other gastrointestinal conditions. Asthma or other breathing conditions. Restless legs syndrome, sleep apnea, or other sleep disorders. Chronic pain. Menopause. Stroke. Abuse of alcohol, tobacco, or illegal drugs. Mental health conditions, such as depression. Caffeine. Neurological disorders, such as Alzheimer's disease. An overactive thyroid (hyperthyroidism). Sometimes, the cause of insomnia may not be known. What increases the risk? Risk factors for insomnia include: Gender. Women are affected more often than men. Age. Insomnia is more common as you get older. Stress. Lack of exercise. Irregular work schedule or working night shifts. Traveling between different time zones. Certain medical and mental health conditions. What are the signs or symptoms? If you have insomnia, the main symptom is having trouble falling asleep or having trouble staying asleep. This may lead to other symptoms, such as: Feeling fatigued or having low energy. Feeling nervous about going to sleep. Not feeling rested in the morning. Having trouble concentrating. Feeling irritable, anxious, or depressed. How is this diagnosed? This condition may be diagnosed  based on: Your symptoms and medical history. Your health care provider may ask about: Your sleep habits. Any medical conditions you have. Your mental health. A physical exam. How is this treated? Treatment for insomnia depends on the cause. Treatment may focus on treating an underlying condition that is causing insomnia. Treatment may also include: Medicines to help you sleep. Counseling or therapy. Lifestyle adjustments to help you sleep better. Follow these instructions at home: Eating and drinking  Limit or avoid alcohol, caffeinated beverages, and cigarettes, especially close to bedtime. These can disrupt your sleep. Do not eat a large meal or eat spicy foods right before bedtime. This can lead to digestive discomfort that can make it hard for you to sleep. Sleep habits  Keep a sleep diary to help you and your health care provider figure out what could be causing your insomnia. Write down: When you sleep. When you wake up during the night. How well you sleep. How rested you feel the next day. Any side effects of medicines you are taking. What you eat and drink. Make your bedroom a dark, comfortable place where it is easy to fall asleep. Put up shades or blackout curtains to block light from outside. Use a white noise machine to block noise. Keep the temperature cool. Limit screen use before bedtime. This includes: Watching TV. Using your smartphone, tablet, or computer. Stick to a routine that includes going to bed and waking up at the same times every day and night. This can help you fall asleep faster. Consider making a quiet activity, such as reading, part of your nighttime routine. Try to avoid taking naps during the day so that you sleep better at night. Get out of bed if you are still awake after 15 minutes of trying to sleep. Keep the lights down, but try reading or doing a  quiet activity. When you feel sleepy, go back to bed. General instructions Take over-the-counter  and prescription medicines only as told by your health care provider. Exercise regularly, as told by your health care provider. Avoid exercise starting several hours before bedtime. Use relaxation techniques to manage stress. Ask your health care provider to suggest some techniques that may work well for you. These may include: Breathing exercises. Routines to release muscle tension. Visualizing peaceful scenes. Make sure that you drive carefully. Avoid driving if you feel very sleepy. Keep all follow-up visits as told by your health care provider. This is important. Contact a health care provider if: You are tired throughout the day. You have trouble in your daily routine due to sleepiness. You continue to have sleep problems, or your sleep problems get worse. Get help right away if: You have serious thoughts about hurting yourself or someone else. If you ever feel like you may hurt yourself or others, or have thoughts about taking your own life, get help right away. You can go to your nearest emergency department or call: Your local emergency services (911 in the [place] A suicide crisis helpline, such as the [place] at (937)262-4863. This is open 24 hours a day. Summary Insomnia is a sleep disorder that makes it difficult to fall asleep or stay asleep. Insomnia can be long-term (chronic) or short-term (acute). Treatment for insomnia depends on the cause. Treatment may focus on treating an underlying condition that is causing insomnia. Keep a sleep diary to help you and your health care provider figure out what could be causing your insomnia. This information is not intended to replace advice given to you by your health care provider. Make sure you discuss any questions you have with your health care provider. Document Revised: 01/13/2020 Document Reviewed: 01/13/2020 Elsevier Patient Education  2022 [place] AND DIET:  We recommended that you  start or continue a regular exercise program for good health. Regular exercise means any activity that makes your heart beat faster and makes you sweat.  We recommend exercising at least 30 minutes per day at least 3 days a week, preferably 4 or 5.  We also recommend a diet low in fat and sugar.  Inactivity, poor dietary choices and obesity can cause diabetes, heart attack, stroke, and kidney damage, among others.    ALCOHOL AND SMOKING:  Women should limit their alcohol intake to no more than 7 drinks/beers/glasses of wine (combined, not each!) per week. Moderation of alcohol intake to this level decreases your risk of breast cancer and liver damage. And of course, no recreational drugs are part of a healthy lifestyle.  And absolutely no smoking or even second hand smoke. Most people know smoking can cause heart and lung diseases, but did you know it also contributes to weakening of your bones? Aging of your skin?  Yellowing of your teeth and nails?  CALCIUM AND VITAMIN D:  Adequate intake of calcium and Vitamin D are recommended.  The recommendations for exact amounts of these supplements seem to change often, but generally speaking 600 mg of calcium (either carbonate or citrate) and 800 units of Vitamin D per day seems prudent. Certain women may benefit from higher intake of Vitamin D.  If you are among these women, your doctor will have told you during your visit.    PAP SMEARS:  Pap smears, to check for cervical cancer or precancers,  have traditionally been done yearly, although recent scientific advances have  shown that most women can have pap smears less often.  However, every woman still should have a physical exam from her gynecologist every year. It will include a breast check, inspection of the vulva and vagina to check for abnormal growths or skin changes, a visual exam of the cervix, and then an exam to evaluate the size and shape of the uterus and ovaries.  And after 72 years of age, a rectal  exam is indicated to check for rectal cancers. We will also provide age appropriate advice regarding health maintenance, like when you should have certain vaccines, screening for sexually transmitted diseases, bone density testing, colonoscopy, mammograms, etc.   MAMMOGRAMS:  All women over 20 years old should have a yearly mammogram. Many facilities now offer a "3D" mammogram, which may cost around $50 extra out of pocket. If possible,  we recommend you accept the option to have the 3D mammogram performed.  It both reduces the number of women who will be called back for extra views which then turn out to be normal, and it is better than the routine mammogram at detecting truly abnormal areas.    COLONOSCOPY:  Colonoscopy to screen for colon cancer is recommended for all women at age 32.  We know, you hate the idea of the prep.  We agree, BUT, having colon cancer and not knowing it is worse!!  Colon cancer so often starts as a polyp that can be seen and removed at colonscopy, which can quite literally save your life!  And if your first colonoscopy is normal and you have no family history of colon cancer, most women don't have to have it again for 10 years.  Once every ten years, you can do something that may end up saving your life, right?  We will be happy to help you get it scheduled when you are ready.  Be sure to check your insurance coverage so you understand how much it will cost.  It may be covered as a preventative service at no cost, but you should check your particular policy.

## 2021-01-09 NOTE — Telephone Encounter (Signed)
Pleases place pap recall for 2024.  Hx ASCUS, neg HR HPV in 2021.  Pap declined today.

## 2021-01-09 NOTE — Telephone Encounter (Signed)
PAPR recall placed for 2024.

## 2021-08-07 ENCOUNTER — Other Ambulatory Visit: Payer: Self-pay | Admitting: Internal Medicine

## 2021-08-07 DIAGNOSIS — R059 Cough, unspecified: Secondary | ICD-10-CM

## 2021-08-07 DIAGNOSIS — R9389 Abnormal findings on diagnostic imaging of other specified body structures: Secondary | ICD-10-CM

## 2021-08-15 ENCOUNTER — Ambulatory Visit
Admission: RE | Admit: 2021-08-15 | Discharge: 2021-08-15 | Disposition: A | Discharge status: Home / Self Care | Payer: Medicare Other | Source: Ambulatory Visit | Attending: Internal Medicine | Admitting: Internal Medicine

## 2021-08-15 DIAGNOSIS — R9389 Abnormal findings on diagnostic imaging of other specified body structures: Secondary | ICD-10-CM

## 2021-08-15 DIAGNOSIS — R059 Cough, unspecified: Secondary | ICD-10-CM

## 2021-12-24 ENCOUNTER — Encounter: Payer: Self-pay | Admitting: Obstetrics and Gynecology

## 2022-01-14 ENCOUNTER — Ambulatory Visit (INDEPENDENT_AMBULATORY_CARE_PROVIDER_SITE_OTHER): Payer: Medicare Other | Admitting: Podiatry

## 2022-01-14 ENCOUNTER — Ambulatory Visit (INDEPENDENT_AMBULATORY_CARE_PROVIDER_SITE_OTHER): Payer: Medicare Other

## 2022-01-14 ENCOUNTER — Encounter: Payer: Self-pay | Admitting: Podiatry

## 2022-01-14 DIAGNOSIS — M722 Plantar fascial fibromatosis: Secondary | ICD-10-CM | POA: Diagnosis not present

## 2022-01-14 DIAGNOSIS — M779 Enthesopathy, unspecified: Secondary | ICD-10-CM

## 2022-01-14 DIAGNOSIS — M7751 Other enthesopathy of right foot: Secondary | ICD-10-CM | POA: Diagnosis not present

## 2022-01-14 MED ORDER — TRIAMCINOLONE ACETONIDE 10 MG/ML IJ SUSP
10.0000 mg | Freq: Once | INTRAMUSCULAR | Status: AC
  Administered 2022-01-14: 10 mg

## 2022-01-14 NOTE — Progress Notes (Signed)
Subjective:   Patient ID: Valerie Wilson, female   DOB: 71 y.o.   MRN: [id number]   HPI Patient presents with bilateral pain right over left stating that its been sore and been going on for about a month does not remember specific injury.  Patient does not smoke likes to be active   Review of Systems  All other systems reviewed and are negative.       Objective:  Physical Exam Vitals and nursing note reviewed.  Constitutional:      Appearance: She is well-developed.  Pulmonary:     Effort: Pulmonary effort is normal.  Musculoskeletal:        General: Normal range of motion.  Skin:    General: Skin is warm.  Neurological:     Mental Status: She is alert.     Neurovascular status found to be intact muscle strength found to be adequate range of motion adequate.  Patient does have forefoot inflammation bilateral right over left that is mostly around the second metatarsal phalangeal joint with mild swelling associated with it.  Good digital perfusion well-oriented x3 probability for     Assessment:  Inflammatory capsulitis second MPJ right over left     Plan:  H&P x-rays reviewed sterile prep I then anesthetize right forefoot I aspirated the second MPJ getting out a small amount of clear fluid injected quarter cc dexamethasone Kenalog applied thick padding to reduce pressure on the joint along with rigid bottom shoes reappoint 3 weeks all questions answered  X-rays indicate some narrowness of the second MPJ no other pathology noted

## 2022-02-06 ENCOUNTER — Encounter: Payer: Self-pay | Admitting: Podiatry

## 2022-02-06 ENCOUNTER — Ambulatory Visit (INDEPENDENT_AMBULATORY_CARE_PROVIDER_SITE_OTHER): Payer: Medicare Other | Admitting: Podiatry

## 2022-02-06 DIAGNOSIS — M7751 Other enthesopathy of right foot: Secondary | ICD-10-CM

## 2022-02-06 MED ORDER — TRIAMCINOLONE ACETONIDE 10 MG/ML IJ SUSP
10.0000 mg | Freq: Once | INTRAMUSCULAR | Status: AC
  Administered 2022-02-06: 10 mg

## 2022-02-09 NOTE — Progress Notes (Signed)
Subjective:   Patient ID: Valerie Wilson, female   DOB: 71 y.o.   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

## 2022-11-15 ENCOUNTER — Encounter: Payer: Self-pay | Admitting: Podiatry

## 2022-11-15 ENCOUNTER — Ambulatory Visit (INDEPENDENT_AMBULATORY_CARE_PROVIDER_SITE_OTHER): Payer: Medicare Other | Admitting: Podiatry

## 2022-11-15 DIAGNOSIS — M7751 Other enthesopathy of right foot: Secondary | ICD-10-CM | POA: Diagnosis not present

## 2022-11-15 MED ORDER — TRIAMCINOLONE ACETONIDE 10 MG/ML IJ SUSP
10.0000 mg | Freq: Once | INTRAMUSCULAR | Status: AC
Start: 2022-11-15 — End: 2022-11-15
  Administered 2022-11-15: 10 mg via INTRA_ARTICULAR

## 2022-11-15 NOTE — Progress Notes (Signed)
Subjective:   Patient ID: Valerie Wilson, female   DOB: 72 y.o.   MRN: [id number]   HPI Patient presents stating the joint right has gotten sore again and it has been 9 months since I had it treated   ROS      Objective:  Physical Exam  Neurovascular status intact inflammation pain right ankle that has improved but is starting to get sore again     Assessment:  Inflammatory capsulitis right sinus tarsi ankle     Plan:  H&P reviewed sterile prep injected the sinus tarsi into the ankle 3 mg Kenalog 5 mg Xylocaine advised on anti-inflammatories support reappoint as symptoms indicate

## 2022-11-25 ENCOUNTER — Other Ambulatory Visit: Payer: Self-pay | Admitting: Medical Genetics

## 2022-11-25 DIAGNOSIS — Z006 Encounter for examination for normal comparison and control in clinical research program: Secondary | ICD-10-CM

## 2022-12-04 NOTE — Progress Notes (Signed)
72 y.o. G0P0000 Married Caucasian female here for annual exam.    Dealing with sinusitis.  Having urinary incontinence with coughing and sneezing.  Occasional urge incontinence.  Emptying her bladder ok.  Wearing panty liners.  Not familiar with Kegel exercise.    Sex can be uncomfortable.  Uses a lubricant.   PCP:   Dr. Nadene Rubins  Patient's last menstrual period was 05/26/2007.           Sexually active: Yes.    The current method of family planning is status post hysterectomy.    Exercising: Yes.     Machines, treadmill, yoga Smoker:  no  Health Maintenance: Pap:  01/05/20 ASCUS: HR HPV neg, 10/31/17 neg History of abnormal Pap:  yes, years ago--ASCUS on pap x2--colpo MMG:  12/24/21 Breast density Cat B, BI-RADS CAT 1 neg Colonoscopy:  06/16/19 - due in 10 years.  Patient declines.  BMD:   12/02/18  Result  normal at [facility].   TDaP:  04/12/13 Gardasil:   no HIV: n/a Hep C: 01/01/16 neg Screening Labs:  PCP   reports that she has never smoked. She has never used smokeless tobacco. She reports current alcohol use. She reports that she does not use drugs.  Past Medical History:  Diagnosis Date   ASCUS on Pap smear 03/18/1992   x2 - colpo   Cancer [facility]) 03/19/2007   endometrial adenocarcinoma   Diverticulosis    citrucel   Diverticulosis    Fibroid 03/18/1996   fundal 1.5 cm   Gallstone    Heart murmur    Hypertension    Osteoarthritis of hand    --right   Pancreatitis    PID (pelvic inflammatory disease) 03/18/1984   laparoscopy    Past Surgical History:  Procedure Laterality Date   ABDOMINAL HYSTERECTOMY  05/26/07   TAH/BSO- Grade 1 endometrial ad   BLEPHAROPLASTY  09/1998   CARPAL TUNNEL RELEASE Right 2004   Dr. Mina Marble   CHOLECYSTECTOMY     CHOLECYSTECTOMY, LAPAROSCOPIC  11/2013   --Dr. Kennis Carina [facility]   COLONOSCOPY     NASAL SEPTUM SURGERY     PLANTAR FASCIA RELEASE Right    TOOTH EXTRACTION      Current Outpatient Medications  Medication Sig  Dispense Refill   albuterol (PROVENTIL HFA;VENTOLIN HFA) 108 (90 Base) MCG/ACT inhaler Inhale into the lungs as needed.     ALPRAZolam (XANAX) 0.5 MG tablet Take 0.5 mg by mouth 3 (three) times daily as needed for anxiety. Patient takes when travels     fluticasone (FLONASE) 50 MCG/ACT nasal spray Place into the nose.     Fluticasone-Salmeterol (ADVAIR) 250-50 MCG/DOSE AEPB Inhale 1 puff into the lungs daily.     guaiFENesin (MUCINEX) 600 MG 12 hr tablet Take 600 mg by mouth daily as needed.     hydrochlorothiazide (HYDRODIURIL) 25 MG tablet Take 25 mg by mouth daily.     loratadine (CLARITIN) 10 MG tablet Take by mouth.     Multiple Vitamin (MULTIVITAMIN) capsule Take 1 capsule by mouth daily.     olmesartan (BENICAR) 20 MG tablet Take 20 mg by mouth daily.     ramipril (ALTACE) 10 MG capsule Take 1 capsule by mouth daily.     rosuvastatin (CRESTOR) 10 MG tablet      No current facility-administered medications for this visit.    Family History  Problem Relation Age of Onset   Diabetes Mother    Hypertension Mother    Stroke Mother  Hypertension Father    Hypertension Sister    Thyroid disease Sister    Esophageal cancer Neg Hx    Colon cancer Neg Hx    Rectal cancer Neg Hx    Stomach cancer Neg Hx     Review of Systems  All other systems reviewed and are negative.   Exam:   BP 128/76 (BP Location: Left Arm, Patient Position: Sitting, Cuff Size: Normal)   Pulse (!) 58   Ht 5' 3.5" (1.613 m)   Wt 191 lb (86.6 kg)   LMP 05/26/2007   SpO2 98%   BMI 33.30 kg/m     General appearance: alert, cooperative and appears stated age Head: normocephalic, without obvious abnormality, atraumatic Neck: no adenopathy, supple, symmetrical, trachea midline and thyroid normal to inspection and palpation Lungs: clear to auscultation bilaterally Breasts: normal appearance, no masses or tenderness, No nipple retraction or dimpling, No nipple discharge or bleeding, No axillary  adenopathy Heart: regular rate and rhythm Abdomen: soft, non-tender; no masses, no organomegaly Extremities: extremities normal, atraumatic, no cyanosis or edema Skin: skin color, texture, turgor normal. No rashes or lesions Lymph nodes: cervical, supraclavicular, and axillary nodes normal. Neurologic: grossly normal  Pelvic: External genitalia:  no lesions              No abnormal inguinal nodes palpated.              Urethra:  normal appearing urethra with no masses, tenderness or lesions              Bartholins and Skenes: normal                 Vagina: normal appearing vagina with normal color and discharge, no lesions.  Atrophy noted.               Cervix: absent               Pap taken: yes Bimanual Exam:  Uterus:  absent              Adnexa: no mass, fullness, tenderness              Rectal exam: yes.  Confirms.              Anus:  normal sphincter tone, no lesions  Chaperone was present for exam:  Warren Lacy, CMA  Assessment:   Other specified personal risk factors Status post TAH/BSO for endometrial CA.  FIGO IB.  2009.  No evidence of recurrence.  Pap ASCUS with negative HR HPV 2021. Atrophy noted on vaginal biopsies.  Atrophy clinically.  Mixed incontinence.   Plan: Mammogram screening discussed. Self breast awareness reviewed. Pap and HR HPV collected.  Guidelines for Calcium, Vitamin D, regular exercise program including cardiovascular and weight bearing exercise. We discussed bladder irritants.  Tx of stress incontinence with pelvic floor therapy, pessary, and midurethral sling risks and benefits reviewed.  She accepts referral for pelvic floor therapy.   Follow up annually and prn.   30 min  total time was spent for this patient encounter, including preparation, face-to-face counseling with the patient, coordination of care, and documentation of the encounter in addition do doing breast and pelvic exam and pap.

## 2022-12-18 ENCOUNTER — Encounter: Payer: Self-pay | Admitting: Obstetrics and Gynecology

## 2022-12-18 ENCOUNTER — Other Ambulatory Visit (HOSPITAL_COMMUNITY)
Admission: RE | Admit: 2022-12-18 | Discharge: 2022-12-18 | Disposition: A | Discharge status: Home / Self Care | Payer: Medicare Other | Source: Ambulatory Visit | Attending: Obstetrics and Gynecology | Admitting: Obstetrics and Gynecology

## 2022-12-18 ENCOUNTER — Ambulatory Visit (INDEPENDENT_AMBULATORY_CARE_PROVIDER_SITE_OTHER): Payer: Medicare Other | Admitting: Obstetrics and Gynecology

## 2022-12-18 ENCOUNTER — Telehealth: Payer: Self-pay | Admitting: Obstetrics and Gynecology

## 2022-12-18 VITALS — BP 128/76 | HR 58 | Ht 63.5 in | Wt 191.0 lb

## 2022-12-18 DIAGNOSIS — Z8742 Personal history of other diseases of the female genital tract: Secondary | ICD-10-CM

## 2022-12-18 DIAGNOSIS — Z124 Encounter for screening for malignant neoplasm of cervix: Secondary | ICD-10-CM | POA: Diagnosis not present

## 2022-12-18 DIAGNOSIS — Z8542 Personal history of malignant neoplasm of other parts of uterus: Secondary | ICD-10-CM | POA: Diagnosis not present

## 2022-12-18 DIAGNOSIS — Z01411 Encounter for gynecological examination (general) (routine) with abnormal findings: Secondary | ICD-10-CM | POA: Insufficient documentation

## 2022-12-18 DIAGNOSIS — Z1151 Encounter for screening for human papillomavirus (HPV): Secondary | ICD-10-CM | POA: Diagnosis not present

## 2022-12-18 DIAGNOSIS — Z9189 Other specified personal risk factors, not elsewhere classified: Secondary | ICD-10-CM

## 2022-12-18 DIAGNOSIS — N3946 Mixed incontinence: Secondary | ICD-10-CM

## 2022-12-18 NOTE — Telephone Encounter (Signed)
Referral sent. To be f/u by referral coordinator via work queue.   Routing to provider for final review and closing encounter.

## 2022-12-18 NOTE — Telephone Encounter (Signed)
Please make a referral to [facility] Pelvic Floor Therapy.   She has mixed incontinence.

## 2022-12-18 NOTE — Patient Instructions (Signed)

## 2022-12-23 LAB — CYTOLOGY - PAP
Comment: NEGATIVE
Diagnosis: UNDETERMINED — AB
High risk HPV: NEGATIVE

## 2022-12-24 ENCOUNTER — Other Ambulatory Visit: Payer: Self-pay | Admitting: Obstetrics and Gynecology

## 2022-12-24 DIAGNOSIS — Z8542 Personal history of malignant neoplasm of other parts of uterus: Secondary | ICD-10-CM

## 2022-12-24 DIAGNOSIS — R87610 Atypical squamous cells of undetermined significance on cytologic smear of cervix (ASC-US): Secondary | ICD-10-CM

## 2022-12-27 ENCOUNTER — Encounter: Payer: Self-pay | Admitting: Obstetrics and Gynecology

## 2022-12-31 ENCOUNTER — Encounter: Payer: Self-pay | Admitting: Obstetrics and Gynecology

## 2023-01-02 NOTE — Progress Notes (Signed)
GYNECOLOGY  VISIT   HPI: 72 y.o.   Married  Caucasian  female   G0P0000 with Patient's last menstrual period was 05/26/2007.   here for  colposcopy.   Pap ASCUS, neg HR HPV.   Hx prior ASCUS paps.  Had had colposcopy in 2021 and bx showed atrophy.   Status post hysterectomy for endometrial cancer.   GYNECOLOGIC HISTORY: Patient's last menstrual period was 05/26/2007. Contraception:  hyst Menopausal hormone therapy:  n/a Last mammogram:  12/26/22 Breast Density Cat B, BI-RADS CAT 1 neg Last pap smear:   12/18/22 ASCUS: HR HPV neg        OB History     Gravida  0   Para  0   Term  0   Preterm  0   AB  0   Living  0      SAB  0   IAB  0   Ectopic  0   Multiple  0   Live Births                 Patient Active Problem List   Diagnosis Date Noted   Pancreatitis 11/30/2013    Class: History of   Endometrial cancer (HCC) 08/26/2012   ASTHMA 04/07/2007   ALLERGIC RHINITIS 04/06/2007    Past Medical History:  Diagnosis Date   ASCUS on Pap smear 03/18/1992   x2 - colpo   Cancer (HCC) 03/19/2007   endometrial adenocarcinoma   Diverticulosis    citrucel   Diverticulosis    Fibroid 03/18/1996   fundal 1.5 cm   Gallstone    Heart murmur    Hypertension    Osteoarthritis of hand    --right   Pancreatitis    PID (pelvic inflammatory disease) 03/18/1984   laparoscopy    Past Surgical History:  Procedure Laterality Date   ABDOMINAL HYSTERECTOMY  05/26/07   TAH/BSO- Grade 1 endometrial ad   BLEPHAROPLASTY  09/1998   CARPAL TUNNEL RELEASE Right 2004   Dr. Mina Marble   CHOLECYSTECTOMY     CHOLECYSTECTOMY, LAPAROSCOPIC  11/2013   --Dr. Kennis Carina [facility]   COLONOSCOPY     NASAL SEPTUM SURGERY     PLANTAR FASCIA RELEASE Right    TOOTH EXTRACTION      Current Outpatient Medications  Medication Sig Dispense Refill   albuterol (PROVENTIL HFA;VENTOLIN HFA) 108 (90 Base) MCG/ACT inhaler Inhale into the lungs as needed.     ALPRAZolam (XANAX) 0.5  MG tablet Take 0.5 mg by mouth 3 (three) times daily as needed for anxiety. Patient takes when travels     fluticasone (FLONASE) 50 MCG/ACT nasal spray Place into the nose.     Fluticasone-Salmeterol (ADVAIR) 250-50 MCG/DOSE AEPB Inhale 1 puff into the lungs daily.     guaiFENesin (MUCINEX) 600 MG 12 hr tablet Take 600 mg by mouth daily as needed.     hydrochlorothiazide (HYDRODIURIL) 25 MG tablet Take 25 mg by mouth daily.     loratadine (CLARITIN) 10 MG tablet Take by mouth.     Multiple Vitamin (MULTIVITAMIN) capsule Take 1 capsule by mouth daily.     olmesartan (BENICAR) 20 MG tablet Take 20 mg by mouth daily.     ramipril (ALTACE) 10 MG capsule Take 1 capsule by mouth daily.     rosuvastatin (CRESTOR) 10 MG tablet      No current facility-administered medications for this visit.     ALLERGIES: Hydrocodone-acetaminophen  Family History  Problem Relation Age of Onset  Diabetes Mother    Hypertension Mother    Stroke Mother    Hypertension Father    Hypertension Sister    Thyroid disease Sister    Esophageal cancer Neg Hx    Colon cancer Neg Hx    Rectal cancer Neg Hx    Stomach cancer Neg Hx     Social History   Socioeconomic History   Marital status: Married    Spouse name: Not on file   Number of children: 2   Years of education: Not on file   Highest education level: Not on file  Occupational History   Occupation: Accountant  Tobacco Use   Smoking status: Never   Smokeless tobacco: Never  Vaping Use   Vaping status: Never Used  Substance and Sexual Activity   Alcohol use: Yes    Comment: 1 glass of wine/month   Drug use: No   Sexual activity: Yes    Partners: Male    Birth control/protection: Surgical    Comment: Vasectomy/TAH/BSO-- more than 5, after 14, no STD, abnormal pap in 2021, no DES  Other Topics Concern   Not on file  Social History Narrative   Not on file   Social Determinants of Health   Financial Resource Strain: Not on file  Food  Insecurity: Not on file  Transportation Needs: Not on file  Physical Activity: Not on file  Stress: Not on file  Social Connections: Not on file  Intimate Partner Violence: Not on file    Review of Systems  All other systems reviewed and are negative.   PHYSICAL EXAMINATION:    BP 136/78 (BP Location: Right Arm, Patient Position: Sitting, Cuff Size: Normal)   Pulse 61   Ht 5' 3.5" (1.613 m)   Wt 191 lb (86.6 kg)   LMP 05/26/2007   SpO2 96%   BMI 33.30 kg/m     General appearance: alert, cooperative and appears stated age Head: Normocephalic, without obvious abnormality, atraumatic  Colposcopy - vagina with biopsy Consent for procedure.  3% acetic acid used in vagina. White light and green light filter used.  Findings:  cervix absent. Atrophy noted with bleeding of the mucosa.  No lesions. Lugol's solution applied.  1.5 cm patch of decreased uptake of left vaginal apex.  Biopsy taken here.  Small scattered islands of decreased uptake of right vaginal apex.  Biopsies:  left vaginal apex. Monsel's placed.  Minimal EBL. No complications.  Chaperone was present for exam:  Warren Lacy, CMA  ASSESSMENT  ASCUS pap and neg HR HPV.  Atrophy noted today.  Prior colpo bx showing atrophy in 2021.  Status post TAH/BSO for endometrial CA.  FIGO IB.  2009.   PLAN  FU biopsy result.  Final plan to follow.

## 2023-01-16 ENCOUNTER — Encounter: Payer: Self-pay | Admitting: Obstetrics and Gynecology

## 2023-01-16 ENCOUNTER — Ambulatory Visit (INDEPENDENT_AMBULATORY_CARE_PROVIDER_SITE_OTHER): Payer: Medicare Other | Admitting: Obstetrics and Gynecology

## 2023-01-16 ENCOUNTER — Other Ambulatory Visit (HOSPITAL_COMMUNITY)
Admission: RE | Admit: 2023-01-16 | Discharge: 2023-01-16 | Disposition: A | Discharge status: Home / Self Care | Payer: Medicare Other | Source: Ambulatory Visit | Attending: Obstetrics and Gynecology | Admitting: Obstetrics and Gynecology

## 2023-01-16 VITALS — BP 136/78 | HR 61 | Ht 63.5 in | Wt 191.0 lb

## 2023-01-16 DIAGNOSIS — R87610 Atypical squamous cells of undetermined significance on cytologic smear of cervix (ASC-US): Secondary | ICD-10-CM | POA: Insufficient documentation

## 2023-01-16 DIAGNOSIS — Z8542 Personal history of malignant neoplasm of other parts of uterus: Secondary | ICD-10-CM

## 2023-01-16 NOTE — Patient Instructions (Signed)
Colposcopy, Care After  The following information offers guidance on how to care for yourself after your procedure. Your health care provider may also give you more specific instructions. If you have problems or questions, contact your health care provider. What can I expect after the procedure? If you had a colposcopy without a biopsy, you can expect to feel fine right away after your procedure. However, you may have some spotting of blood for a few days. You can return to your normal activities. If you had a colposcopy with a biopsy, it is common after the procedure to have: Soreness and mild pain. These may last for a few days. Mild vaginal bleeding or discharge that is dark-colored and grainy. This may last for a few days. The discharge may be caused by a liquid (solution) that was used during the procedure. You may need to wear a sanitary pad during this time. Spotting of blood for at least 48 hours after the procedure. Follow these instructions at home: Medicines Take over-the-counter and prescription medicines only as told by your health care provider. Talk with your health care provider about what type of over-the-counter pain medicines and prescription medicines you can start to take again. It is especially important to talk with your health care provider if you take blood thinners. Activity Avoid using douche products, using tampons, and having sex for at least 3 days after the procedure or for as long as told by your health care provider. Return to your normal activities as told by your health care provider. Ask your health care provider what activities are safe for you. General instructions Ask your health care provider if you may take baths, swim, or use a hot tub. You may take showers. If you use birth control (contraception), continue to use it. Keep all follow-up visits. This is important. Contact a health care provider if: You have a fever or chills. You faint or feel  light-headed. Get help right away if: You have heavy bleeding from your vagina or pass blood clots. Heavy bleeding is bleeding that soaks through a sanitary pad in less than 1 hour. You have vaginal discharge that is abnormal, is yellow in color, or smells bad. This could be a sign of infection. You have severe pain or cramps in your lower abdomen that do not go away with medicine. Summary If you had a colposcopy without a biopsy, you can expect to feel fine right away, but you may have some spotting of blood for a few days. You can return to your normal activities. If you had a colposcopy with a biopsy, it is common to have mild pain for a few days and spotting for 48 hours after the procedure. Avoid using douche products, using tampons, and having sex for at least 3 days after the procedure or for as long as told by your health care provider. Get help right away if you have heavy bleeding, severe pain, or signs of infection. This information is not intended to replace advice given to you by your health care provider. Make sure you discuss any questions you have with your health care provider. Document Revised: 07/30/2020 Document Reviewed: 07/30/2020 Elsevier Patient Education  2024 Elsevier Inc.  

## 2023-01-17 LAB — SURGICAL PATHOLOGY

## 2023-02-10 ENCOUNTER — Encounter (HOSPITAL_BASED_OUTPATIENT_CLINIC_OR_DEPARTMENT_OTHER): Payer: Self-pay | Admitting: *Deleted

## 2023-02-10 ENCOUNTER — Encounter (HOSPITAL_BASED_OUTPATIENT_CLINIC_OR_DEPARTMENT_OTHER): Payer: Self-pay | Admitting: Cardiovascular Disease

## 2023-02-10 ENCOUNTER — Ambulatory Visit (HOSPITAL_BASED_OUTPATIENT_CLINIC_OR_DEPARTMENT_OTHER): Payer: Medicare Other | Admitting: Cardiovascular Disease

## 2023-02-10 ENCOUNTER — Ambulatory Visit (INDEPENDENT_AMBULATORY_CARE_PROVIDER_SITE_OTHER): Payer: Medicare Other | Admitting: Cardiovascular Disease

## 2023-02-10 VITALS — BP 136/74 | HR 58 | Ht 64.0 in | Wt 195.2 lb

## 2023-02-10 DIAGNOSIS — Z136 Encounter for screening for cardiovascular disorders: Secondary | ICD-10-CM

## 2023-02-10 DIAGNOSIS — I700 Atherosclerosis of aorta: Secondary | ICD-10-CM

## 2023-02-10 DIAGNOSIS — I447 Left bundle-branch block, unspecified: Secondary | ICD-10-CM

## 2023-02-10 DIAGNOSIS — E7800 Pure hypercholesterolemia, unspecified: Secondary | ICD-10-CM

## 2023-02-10 DIAGNOSIS — I340 Nonrheumatic mitral (valve) insufficiency: Secondary | ICD-10-CM

## 2023-02-10 DIAGNOSIS — I10 Essential (primary) hypertension: Secondary | ICD-10-CM

## 2023-02-10 DIAGNOSIS — R002 Palpitations: Secondary | ICD-10-CM

## 2023-02-10 HISTORY — DX: Left bundle-branch block, unspecified: I44.7

## 2023-02-10 HISTORY — DX: Atherosclerosis of aorta: I70.0

## 2023-02-10 HISTORY — DX: Nonrheumatic mitral (valve) insufficiency: I34.0

## 2023-02-10 HISTORY — DX: Pure hypercholesterolemia, unspecified: E78.00

## 2023-02-10 NOTE — Patient Instructions (Addendum)
Medication Instructions:  Your physician recommends that you continue on your current medications as directed. Please refer to the Current Medication list given to you today.   *If you need a refill on your cardiac medications before your next appointment, please call your pharmacy*  Lab Work: NONE   Testing/Procedures: Your physician has requested that you have an echocardiogram. Echocardiography is a painless test that uses sound waves to create images of your heart. It provides your doctor with information about the size and shape of your heart and how well your heart's chambers and valves are working. This procedure takes approximately one hour. There are no restrictions for this procedure. Please do NOT wear cologne, perfume, aftershave, or lotions (deodorant is allowed). Please arrive 15 minutes prior to your appointment time.  Please note: We ask at that you not bring children with you during ultrasound (echo/ vascular) testing. Due to room size and safety concerns, children are not allowed in the ultrasound rooms during exams. Our front office staff cannot provide observation of children in our lobby area while testing is being conducted. An adult accompanying a patient to their appointment will only be allowed in the ultrasound room at the discretion of the ultrasound technician under special circumstances. We apologize for any inconvenience.  Follow-Up: At [facility], you and your health needs are our priority.  As part of our continuing mission to provide you with exceptional heart care, we have created designated Provider Care Teams.  These Care Teams include your primary Cardiologist (physician) and Advanced Practice Providers (APPs -  Physician Assistants and Nurse Practitioners) who all work together to provide you with the care you need, when you need it.  We recommend signing up for the patient portal called "MyChart".  Sign up information is provided on this After Visit  Summary.  MyChart is used to connect with patients for Virtual Visits (Telemedicine).  Patients are able to view lab/test results, encounter notes, upcoming appointments, etc.  Non-urgent messages can be sent to your provider as well.   To learn more about what you can do with MyChart, go to [email or web address].    Your next appointment:   6 month(s)  Provider:   Chilton Si, MD or Gillian Shields, NP    1 TO 2 MONTHS WITH KRISTIN PHARM D AT EITHER [facility] OR [facility] LOCATION  Other Instructions MONITOR YOUR BLOOD PRESSURE, BRING YOUR MACHINE AND READINGS TO YOUR FOLLOW UP APPOINTMENT

## 2023-02-10 NOTE — Progress Notes (Signed)
Cardiology Office Note:  .   Date:  02/10/2023  ID:  Valerie Wilson, DOB Sep 14, 1950, MRN [id number] PCP: Valerie Quitter, MD  [facility] Providers Cardiologist:  None    History of Present Illness: .    Valerie Wilson is a 72 y.o. female with mild aortic atherosclerosis, hypertension, hyperlipidemia, LBBB, moderate mitral regurgitation, mitral valve prolapse, history of uterine cancer and asthma who is being seen today for the evaluation of palpitations at the request of Valerie Wilson*.  She saw her PCP 10/2022 and reported episodic palpitations.  In 2015 she was noted to have a  LBBB.  She had a nuclear stress in 2015 that reealed LVEF 76% and no ischemia.  She had an echo in 2022 that revealed LVEF 60-65% with mild LVH, grade 1 diastolic dysfunction and moderate MR 2/2 anterior leaflet prolapse.  Valerie Wilson presents for a consultation due to occasional palpitations. The patient reported experiencing these palpitations a few times a year, with one notable episode lasting a couple of hours. However, she denied any associated chest pain or pressure. The patient did note a peculiar sensation described as a 'gurgle' or 'bubble' in the chest, lasting only seconds and occurring sporadically.  The patient's family history is significant for heart disease, with both parents having suffered from it. The patient's father died of a heart attack at the age of 40, and the mother had multiple strokes. The patient's sister also has heart issues, and both brothers are overweight with high blood pressure.  Regarding lifestyle, the patient admitted to a lack of regular exercise, although she reported being active around the house and yard. She described feeling better and mentally clearer when physically active. The patient's diet is primarily home-cooked meals, with a preference for salmon and chicken, and limited intake of red meat. She acknowledged that her salt intake could be better controlled.  The  patient's cholesterol levels were well-controlled on rosuvastatin, and blood pressure was slightly elevated despite being on Benicar.  In 2015, the patient underwent a stress test due to a detected murmur, which was found to be normal. More recently, in 2022, an echocardiogram showed moderate leaking of the mitral valve and moderate enlargement of the left atrium. The patient also reported a history of peripheral neuropathy and arthritis in the feet.  In summary, the patient presented with occasional palpitations and a history of mitral valve prolapse and left bundle branch block. The patient's lifestyle and diet, along with a strong family history of heart disease, contribute to her cardiovascular risk. The patient agreed to lifestyle modifications and home monitoring of blood pressure to improve her cardiovascular health.      ROS: As per HPI  Studies Reviewed: Marland Kitchen   EKG Interpretation Date/Time:  Monday February 10 2023 08:42:33 EST Ventricular Rate:  65 PR Interval:  210 QRS Duration:  150 QT Interval:  440 QTC Calculation: 457 R Axis:   17  Text Interpretation: Sinus rhythm with sinus arrhythmia with 1st degree A-V block Left bundle branch block When compared with ECG of 22-May-2007 08:06, No significant change was found Confirmed by Valerie Wilson ([id number]) on 02/10/2023 8:56:03 AM   2015 Valerie Wilson Myoview 2022 echo   Risk Assessment/Calculations:             Physical Exam:    VS:  BP 136/74 (BP Location: Left Arm, Patient Position: Sitting, Cuff Size: Normal)   Pulse (!) 58   Ht 5\' 4"  (1.626 m)   Wt  195 lb 3.2 oz (88.5 kg)   LMP 05/26/2007   SpO2 97%   BMI 33.51 kg/m  , BMI Body mass index is 33.51 kg/m. GENERAL:  Well appearing HEENT: Pupils equal round and reactive, fundi not visualized, oral mucosa unremarkable NECK:  No jugular venous distention, waveform within normal limits, carotid upstroke brisk and symmetric, no bruits, no thyromegaly LUNGS:  Clear to  auscultation bilaterally HEART:  RRR.  PMI not displaced or sustained,S1 and S2 within normal limits, no S3, no S4, no clicks, no rubs, II/VI systolic murmur at apex ABD:  Flat, positive bowel sounds normal in frequency in pitch, no bruits, no rebound, no guarding, no midline pulsatile mass, no hepatomegaly, no splenomegaly EXT:  2 plus pulses throughout, no edema, no cyanosis no clubbing SKIN:  No rashes no nodules NEURO:  Cranial nerves II through XII grossly intact, motor grossly intact throughout PSYCH:  Cognitively intact, oriented to person place and time   ASSESSMENT AND PLAN: .    # LBBB: Stable.  Not clinically significant.  Negative stress in 2015.  # Mitral Valve Prolapse with Moderate Regurgitation Infrequent palpitations, no chest pain, shortness of breath with exertion. Left atrium moderately enlarged. No signs of heart failure. Family history of heart disease. -Repeat echocardiogram to assess stability of mitral regurgitation. -Order 30-day heart monitor to screen for atrial fibrillation.  # Hypertension Blood pressure slightly above target (goal <130/80). Currently on Benicar 20mg /25mg . -Increase physical activity and monitor salt intake. -Monitor blood pressure at home twice daily (morning and evening) for a month. -Review blood pressure readings with pharmacist in 1 month.  # Hyperlipidemia Well controlled on Rosuvastatin (Crestor). Mild plaque in aorta noted on CT scan. -Continue Rosuvastatin.  Follow-up in 6 months or sooner if echocardiogram or heart monitor results are concerning.       Signed, Valerie Si, MD

## 2023-02-10 NOTE — Addendum Note (Signed)
Addended by: Regis Bill B on: 02/10/2023 10:49 AM   Modules accepted: Orders

## 2023-02-11 ENCOUNTER — Encounter: Payer: Self-pay | Admitting: *Deleted

## 2023-02-11 NOTE — Progress Notes (Signed)
Patient enrolled for [employer]/ [employer] to ship a 30 day cardiac event monitor to her address on file.

## 2023-02-21 DIAGNOSIS — R002 Palpitations: Secondary | ICD-10-CM

## 2023-03-02 NOTE — Therapy (Incomplete)
OUTPATIENT PHYSICAL THERAPY FEMALE PELVIC EVALUATION   Patient Name: Valerie Wilson MRN: [id number] DOB:1950/10/15, 72 y.o., female Today's Date: 03/03/2023  END OF SESSION:  PT End of Session - 03/03/23 1358     Visit Number 1    Date for PT Re-Evaluation 04/28/23    Authorization Type Medicare A+B and BCBS supplement    Authorization Time Period 03/03/2023-04/27/2022    PT Start Time 0930    PT Stop Time 1015    PT Time Calculation (min) 45 min    Activity Tolerance Patient tolerated treatment well    Behavior During Therapy [facility] for tasks assessed/performed             Past Medical History:  Diagnosis Date   Aortic atherosclerosis (HCC) 02/10/2023   ASCUS on Pap smear 03/18/1992   x2 - colpo   Cancer (HCC) 03/19/2007   endometrial adenocarcinoma   Diverticulosis    citrucel   Diverticulosis    Fibroid 03/18/1996   fundal 1.5 cm   Gallstone    Heart murmur    Hypertension    LBBB (left bundle branch block) 02/10/2023   Mitral regurgitation 02/10/2023   Osteoarthritis of hand    --right   Pancreatitis    PID (pelvic inflammatory disease) 03/18/1984   laparoscopy   Pure hypercholesterolemia 02/10/2023   Past Surgical History:  Procedure Laterality Date   ABDOMINAL HYSTERECTOMY  05/26/07   TAH/BSO- Grade 1 endometrial ad   BLEPHAROPLASTY  09/1998   CARPAL TUNNEL RELEASE Right 2004   Dr. Mina Marble   CHOLECYSTECTOMY     CHOLECYSTECTOMY, LAPAROSCOPIC  11/2013   --Dr. Kennis Carina [facility]   COLONOSCOPY     NASAL SEPTUM SURGERY     PLANTAR FASCIA RELEASE Right    TOOTH EXTRACTION     Patient Active Problem List   Diagnosis Date Noted   LBBB (left bundle branch block) 02/10/2023   Aortic atherosclerosis (HCC) 02/10/2023   Pure hypercholesterolemia 02/10/2023   Mitral regurgitation 02/10/2023   Pancreatitis 11/30/2013    Class: History of   Endometrial cancer (HCC) 08/26/2012   ASTHMA 04/07/2007   ALLERGIC RHINITIS 04/06/2007    PCP: Melida Quitter,  MD PCP - General  REFERRING PROVIDER: Patton Salles, MD   REFERRING DIAG: N39.46 (ICD-10-CM) - Mixed incontinence   THERAPY DIAG:  Muscle weakness (generalized) - Plan: PT plan of care cert/re-cert  Unspecified lack of coordination - Plan: PT plan of care cert/re-cert  Rationale for Evaluation and Treatment: Rehabilitation  ONSET DATE: 2022  SUBJECTIVE:  SUBJECTIVE STATEMENT: Pt reports that when she has a coughing fit, she ends up tinkling in her pants. Also when she empties her bladder, she gets up she drips a little.   Fluid intake: to be asked   PAIN:  Are you having pain? Yes NPRS scale: 5/10 Pain location: left knee d/t sprain  Pain type: sharp Pain description: stabbing   Aggravating factors: walking Relieving factors: resting, sometimes moving it  PRECAUTIONS: None  RED FLAGS: None   WEIGHT BEARING RESTRICTIONS: No  FALLS:  Has patient fallen in last 6 months? No  LIVING ENVIRONMENT: Lives with: lives with their spouse Lives in: House/apartment Stairs: No Has following equipment at home: None  OCCUPATION: accountant  PLOF: Independent  PATIENT GOALS: not to leak, develop some core strength, reports when she is down on the floor, she used to be bale to raise herself up, she is not able to anymore  PERTINENT HISTORY:  Hysterectomy- 2009 Endometrial cancer- years ago Gall bladder removed  Sexual abuse: No  BOWEL MOVEMENT: no issues Fiber supplement: Yes: citrucel  URINATION: Pain with urination: No Fully empty bladder: Yes: drips after emptying Stream: Strong Urgency: No Frequency: no Leakage: Coughing and Sneezing Pads: Yes: panty liner  INTERCOURSE: Pain with intercourse: Initial Penetration- d/t dryness- she does not have intercourse  very  often Ability to have vaginal penetration:  Yes:   Climax: yes Marinoff Scale: 1/3  PREGNANCY: Vaginal deliveries no Husband has daughters PROLAPSE: None   OBJECTIVE:  Note: Objective measures were completed at Evaluation unless otherwise noted.     COGNITION: Overall cognitive status: Within functional limits for tasks assessed     SENSATION: Light touch: Appears intact Proprioception: Appears intact  MUSCLE LENGTH: Hamstrings: Right 70 deg; Left 70 deg   LUMBAR SPECIAL TESTS:  Long sit test: Negative  GAIT: Comments: antalgic d/t left knee pain  POSTURE: rounded shoulders, forward head, decreased lumbar lordosis, and posterior pelvic tilt  PELVIC ALIGNMENT: even  LUMBARAROM/PROM: grossly within functional limitations   LOWER EXTREMITY ROM:  Active ROM Right eval Left eval  Hip flexion Within functional limitations  Within functional limitations                       Knee flexion  -10  LOWER EXTREMITY MMT:  MMT Right eval Left eval  Hip flexion 4-/5 4-/5   PALPATION:   General  non tender large abdominal scars, some restrictions throughout abdomen                External Perineal Exam to be assessed at a future date if needed                             Internal Pelvic Floor to be assessed  Patient confirms identification and approves PT to assess internal pelvic floor and treatment Yes  PELVIC MMT:   MMT eval  Vaginal   Internal Anal Sphincter   External Anal Sphincter   Puborectalis   Diastasis Recti   (Blank rows = not tested)        TONE: Low abdominal tone  PROLAPSE: To be assessed at a future date if needed  TODAY'S TREATMENT:  DATE: 03/03/2023  EVAL see below  Neuro reed- cough with pelvic floor lift,  ball press with transverse abdominis breath               Bridging with transverse abdominis  breath  PATIENT EDUCATION:  Education details: relevant anatomy, lubricants and moisturizers, exercises for core strengthening and coordination Person educated: Patient Education method: Explanation, Demonstration, Tactile cues, and Handouts Education comprehension: verbalized understanding and needs further education  HOME EXERCISE PROGRAM: [id number]  ASSESSMENT:  CLINICAL IMPRESSION: Patient is a 72 y.o. F who was seen today for physical therapy evaluation and treatment for stress urinary incontinence. She is wearing a Halter monitor and left knee soft brace. She has low abdominal tone and hx of several abdominal surgeries and endometrial cancer. Reported vaginal dryness. Weakness bilateral lower extremities throughout. Did well with exercises for core coordination, Needed some VC's and TC's and modifications d/t her recent left knee injury and pain.  Pt will benefit from PT to achieve goals, reduce leaking and improve quality of life.   OBJECTIVE IMPAIRMENTS: decreased activity tolerance, decreased coordination, decreased endurance, difficulty walking, decreased strength, increased fascial restrictions, obesity, and pain.   ACTIVITY LIMITATIONS: continence and toileting  PARTICIPATION LIMITATIONS: interpersonal relationship and community activity  PERSONAL FACTORS: Age and Time since onset of injury/illness/exacerbation are also affecting patient's functional outcome.   REHAB POTENTIAL: Good  CLINICAL DECISION MAKING: Stable/uncomplicated  EVALUATION COMPLEXITY: Low   GOALS: Goals reviewed with patient? Yes  SHORT TERM GOALS: Target date: 03/31/2023    Pt will be I with her initial HEP and dem all exercises correctly and consistently Baseline: Goal status: INITIAL  2.  Pt will report 50% reduced leaking with coughing, sneezing and laughing Baseline:  Goal status: INITIAL  3.  Pt will be I with double void Baseline:  Goal status: INITIAL  4.  Pt will demonstrate at  least 4/5 abdominal strength Baseline:  Goal status: INITIAL   LONG TERM GOALS: Target date: 04/28/2023   Pt will soak 0 pads/ day Baseline:  Goal status: INITIAL  2.  Pt will be I with her advanced HEP Baseline:  Goal status: INITIAL  3.  Pt will be able to get off the floor under 5 seconds Baseline:  Goal status: INITIAL  4.  Pt will dem improved bilateral hip and pelvic floor strength to at least 4/5 Baseline:  Goal status: INITIAL   PLAN:  PT FREQUENCY: 1-2x/week  PT DURATION: 8 weeks  PLANNED INTERVENTIONS: 97110-Therapeutic exercises, 97530- Therapeutic activity, 97112- Neuromuscular re-education, 97535- Self Care, [id number]- Manual therapy, [phone]- Electrical stimulation (manual), Patient/Family education, Dry Needling, Joint mobilization, Joint manipulation, Spinal manipulation, Spinal mobilization, Scar mobilization, DME instructions, Moist heat, and Biofeedback  PLAN FOR NEXT SESSION: core coordination and strengthening exercises   Austynn Pridmore, PT 03/03/23 7:26 PM

## 2023-03-03 ENCOUNTER — Encounter: Payer: Self-pay | Admitting: Physical Therapy

## 2023-03-03 ENCOUNTER — Other Ambulatory Visit: Payer: Self-pay

## 2023-03-03 ENCOUNTER — Ambulatory Visit: Payer: Medicare Other | Attending: Obstetrics and Gynecology | Admitting: Physical Therapy

## 2023-03-03 ENCOUNTER — Ambulatory Visit (HOSPITAL_BASED_OUTPATIENT_CLINIC_OR_DEPARTMENT_OTHER): Payer: Medicare Other

## 2023-03-03 DIAGNOSIS — M6281 Muscle weakness (generalized): Secondary | ICD-10-CM | POA: Insufficient documentation

## 2023-03-03 DIAGNOSIS — Z136 Encounter for screening for cardiovascular disorders: Secondary | ICD-10-CM | POA: Diagnosis not present

## 2023-03-03 DIAGNOSIS — N3946 Mixed incontinence: Secondary | ICD-10-CM | POA: Diagnosis not present

## 2023-03-03 DIAGNOSIS — I340 Nonrheumatic mitral (valve) insufficiency: Secondary | ICD-10-CM | POA: Diagnosis not present

## 2023-03-03 DIAGNOSIS — R279 Unspecified lack of coordination: Secondary | ICD-10-CM | POA: Diagnosis present

## 2023-03-03 DIAGNOSIS — I10 Essential (primary) hypertension: Secondary | ICD-10-CM

## 2023-03-03 LAB — ECHOCARDIOGRAM COMPLETE
Area-P 1/2: 3.72 cm2
MV M vel: 4.66 m/s
MV Peak grad: 86.9 mm[Hg]
Radius: 0.6 cm

## 2023-03-17 ENCOUNTER — Ambulatory Visit: Payer: Medicare Other | Admitting: Physical Therapy

## 2023-03-19 HISTORY — PX: KNEE SURGERY: SHX244

## 2023-03-24 ENCOUNTER — Ambulatory Visit: Payer: Medicare Other | Admitting: Physical Therapy

## 2023-03-25 ENCOUNTER — Encounter (INDEPENDENT_AMBULATORY_CARE_PROVIDER_SITE_OTHER): Payer: Self-pay

## 2023-03-25 ENCOUNTER — Ambulatory Visit (INDEPENDENT_AMBULATORY_CARE_PROVIDER_SITE_OTHER): Payer: Medicare Other

## 2023-03-25 VITALS — BP 140/79 | HR 70 | Ht 64.0 in | Wt 190.0 lb

## 2023-03-25 DIAGNOSIS — J310 Chronic rhinitis: Secondary | ICD-10-CM | POA: Diagnosis not present

## 2023-03-25 DIAGNOSIS — J343 Hypertrophy of nasal turbinates: Secondary | ICD-10-CM

## 2023-03-25 DIAGNOSIS — R0981 Nasal congestion: Secondary | ICD-10-CM

## 2023-03-25 DIAGNOSIS — J322 Chronic ethmoidal sinusitis: Secondary | ICD-10-CM

## 2023-03-25 DIAGNOSIS — J320 Chronic maxillary sinusitis: Secondary | ICD-10-CM | POA: Diagnosis not present

## 2023-03-25 NOTE — Progress Notes (Signed)
 Patient ID: Valerie Wilson, female   DOB: 1950-04-09, 73 y.o.   MRN: [id number]  Follow-up: Chronic rhinosinusitis  HPI: The patient is a 73 year old female who returns today for her follow-up evaluation.  The patient has a history of chronic rhinosinusitis, with recurrent exacerbations.  She was last seen in September 2024.  At that time, her chronic maxillary and ethmoid sinusitis were under controlled.  She was noted to have nasal mucosal congestion and bilateral inferior turbinate hypertrophy.  She was treated with mupirocin/budesonide nasal irrigation, Flonase nasal spray, Mucinex, and Claritin.  The patient returns today reporting that she was doing well until 1 week ago, when she had an upper respiratory infection.  Her symptoms include nasal congestion, nasal drainage, and sore throat.  She denies any fever or visual change.  Exam: General: Communicates without difficulty, well nourished, no acute distress. Head: Normocephalic, no evidence injury, no tenderness, facial buttresses intact without stepoff. Face/sinus: No tenderness to palpation and percussion. Facial movement is normal and symmetric. Eyes: PERRL, EOMI. No scleral icterus, conjunctivae clear. Neuro: CN II exam reveals vision grossly intact.  No nystagmus at any point of gaze. Ears: Auricles well formed without lesions.  Ear canals are intact without mass or lesion.  No erythema or edema is appreciated.  The TMs are intact without fluid. Nose: External evaluation reveals normal support and skin without lesions.  Dorsum is intact.  Anterior rhinoscopy reveals congested mucosa over anterior aspect of inferior turbinates and intact septum.  No purulence noted. Oral:  Oral cavity and oropharynx are intact, symmetric, without erythema or edema.  Mucosa is moist without lesions. Neck: Full range of motion without pain.  There is no significant lymphadenopathy.  No masses palpable.  Thyroid  bed within normal limits to palpation.  Parotid glands and  submandibular glands equal bilaterally without mass.  Trachea is midline. Neuro:  CN 2-12 grossly intact.   Assessment: 1.  Recent viral URI. 2.  Chronic maxillary and ethmoid sinusitis.  No purulent drainage is noted today. 3.  Chronic rhinitis with nasal mucosal congestion and bilateral inferior turbinate hypertrophy.  Plan: 1.  The physical exam findings are reviewed with the patient. 2.  Continue with mupirocin/budesonide nasal irrigation daily. 3.  Continue with Flonase, Mucinex, and Claritin as needed. 4.  The patient will return for reevaluation in 4 months, sooner if needed.

## 2023-03-26 ENCOUNTER — Ambulatory Visit (INDEPENDENT_AMBULATORY_CARE_PROVIDER_SITE_OTHER): Payer: Medicare Other

## 2023-03-26 ENCOUNTER — Encounter: Payer: Self-pay | Admitting: Pharmacist Clinician (PhC)/ Clinical Pharmacy Specialist

## 2023-03-26 ENCOUNTER — Ambulatory Visit: Payer: Medicare Other | Attending: Cardiology | Admitting: Pharmacist Clinician (PhC)/ Clinical Pharmacy Specialist

## 2023-03-26 VITALS — BP 118/75 | HR 60

## 2023-03-26 DIAGNOSIS — J320 Chronic maxillary sinusitis: Secondary | ICD-10-CM | POA: Insufficient documentation

## 2023-03-26 DIAGNOSIS — R002 Palpitations: Secondary | ICD-10-CM

## 2023-03-26 DIAGNOSIS — J343 Hypertrophy of nasal turbinates: Secondary | ICD-10-CM | POA: Insufficient documentation

## 2023-03-26 DIAGNOSIS — I10 Essential (primary) hypertension: Secondary | ICD-10-CM | POA: Diagnosis present

## 2023-03-26 DIAGNOSIS — J322 Chronic ethmoidal sinusitis: Secondary | ICD-10-CM | POA: Insufficient documentation

## 2023-03-26 NOTE — Assessment & Plan Note (Signed)
 Assessment: BP is controlled in office BP 118/75 mmHg;  above the goal (<130/80). Tolerates olmesartan and hydrochlorothiazide well without any side effects Denies SOB, palpitation, chest pain, headaches,or swelling Reiterated the importance of regular exercise and low salt diet   Plan:  Continue taking olmesartan 20 mg every day, hydrochlorothiazide 25 mg every day  Patient to keep record of BP readings about 3 days per week and average readings occasionally to be sure still < 130/80 Patient to follow up with Dr. Raford  in spring  Labs ordered today:  none

## 2023-03-26 NOTE — Patient Instructions (Signed)
 Follow up appointment: with Dr. Raford in the spring.    Take your BP meds as follows: Continue with your current medications  Check your blood pressure at home about 3 days per week and keep record of the readings.  Your blood pressure goal is < 130/80  To check your pressure at home you will need to:  1. Sit up in a chair, with feet flat on the floor and back supported. Do not cross your ankles or legs. 2. Rest your left arm so that the cuff is about heart level. If the cuff goes on your upper arm,  then just relax the arm on the table, arm of the chair or your lap. If you have a wrist cuff, we  suggest relaxing your wrist against your chest (think of it as Pledging the Flag with the  wrong arm).  3. Place the cuff snugly around your arm, about 1 inch above the crook of your elbow. The  cords should be inside the groove of your elbow.  4. Sit quietly, with the cuff in place, for about 5 minutes. After that 5 minutes press the power  button to start a reading. 5. Do not talk or move while the reading is taking place.  6. Record your readings on a sheet of paper. Although most cuffs have a memory, it is often  easier to see a pattern developing when the numbers are all in front of you.  7. You can repeat the reading after 1-3 minutes if it is recommended  Make sure your bladder is empty and you have not had caffeine or tobacco within the last 30 min  Always bring your blood pressure log with you to your appointments. If you have not brought your monitor in to be double checked for accuracy, please bring it to your next appointment.  You can find a list of quality blood pressure cuffs at [email or web address]  Important lifestyle changes to control high blood pressure  Intervention  Effect on the BP  Lose extra pounds and watch your waistline Weight loss is one of the most effective lifestyle changes for controlling blood pressure. If you're overweight or obese, losing even a small amount  of weight can help reduce blood pressure. Blood pressure might go down by about 1 millimeter of mercury (mm Hg) with each kilogram (about 2.2 pounds) of weight lost.  Exercise regularly As a general goal, aim for at least 30 minutes of moderate physical activity every day. Regular physical activity can lower high blood pressure by about 5 to 8 mm Hg.  Eat a healthy diet Eating a diet rich in whole grains, fruits, vegetables, and low-fat dairy products and low in saturated fat and cholesterol. A healthy diet can lower high blood pressure by up to 11 mm Hg.  Reduce salt (sodium) in your diet Even a small reduction of sodium in the diet can improve heart health and reduce high blood pressure by about 5 to 6 mm Hg.  Limit alcohol One drink equals 12 ounces of beer, 5 ounces of wine, or 1.5 ounces of 80-proof liquor.  Limiting alcohol to less than one drink a day for women or two drinks a day for men can help lower blood pressure by about 4 mm Hg.   If you have any questions or concerns please use My Chart to send questions or call the office at [phone]

## 2023-03-26 NOTE — Progress Notes (Signed)
 Office Visit    Patient Name: Valerie Wilson Date of Encounter: 03/26/2023  Primary Care Provider:  Stephane Leita DEL, MD Primary Cardiologist:  None  Chief Complaint    Hypertension  Significant Past Medical History   Mitral valve prolapse Moderate regurgitation, EF stable  HLD 9/24 LDL 56 on rosuvastatin 10  CAD Mild aortic atherosclerosis  asthma Advair daily this time of year, prn albuterol    Allergies  Allergen Reactions   Hydrocodone-Acetaminophen Nausea And Vomiting    History of Present Illness    Valerie Wilson is a 73 y.o. female patient of Dr Raford, in the office today for hypertension evaluation.  She was seen by Dr. Raford in November because of occasional palpitations.  She was put on a 30 day monitor, which was just sent back in the last week.  Her BP was only mildly elevated in the office that day (136/74), and she was asked to monitor at home and return in a month  Today she is in the office, with her home Omron BP device.  Her home    Home Omron meter 123/71  HR 58  Blood Pressure Goal:  130/80  Current Medications: hydrochlorothiazide 25 mg every day, olmesartan 20 mg every day   Family Hx:   father died from MI at 24, mother had multiple strokes, brothers with hypertension  Social Hx:      Tobacco: no  Alcohol: very little  Caffeine: coffee each morning, unsweet tea, rare Coke  Diet:  mostly home cooked meals, lots of fresh vegetables, soups, seldom red meat, more chicken, snacks occasionally - Lays poppers, only occasional ice cream    Exercise: [place] until tore meniscus, needs surgery next week, PT after that  Home BP readings:  bought new machine about 1 week ago, previous device was reading much higher. Checked her device in the office, read within 10 points of office device    13 readings since purchased new Omron device - average 130/74  Adherence Assessment  Do you ever forget to take your medication? [] Yes [x] No  Do you ever skip  doses due to side effects? [] Yes [x] No  Do you have trouble affording your medicines? [] Yes [x] No  Are you ever unable to pick up your medication due to transportation difficulties? [] Yes [x] No   Adherence strategy: 7 day minder  Accessory Clinical Findings    Lab Results  Component Value Date   CREATININE 0.57 05/27/2007   BUN 7 05/27/2007   NA 136 05/27/2007   K 3.9 05/27/2007   CL 104 05/27/2007   CO2 25 05/27/2007   Lab Results  Component Value Date   ALT 65 (H) 11/23/2013   AST 21 11/23/2013   ALKPHOS 98 11/23/2013   BILITOT 1.0 11/23/2013   No results found for: HGBA1C  Home Medications    Current Outpatient Medications  Medication Sig Dispense Refill   albuterol (PROVENTIL HFA;VENTOLIN HFA) 108 (90 Base) MCG/ACT inhaler Inhale into the lungs as needed.     ALPRAZolam (XANAX) 0.5 MG tablet Take 0.5 mg by mouth 3 (three) times daily as needed for anxiety. Patient takes when travels     citalopram (CELEXA) 10 MG tablet Take 10 mg by mouth daily as needed.     fluticasone (FLONASE) 50 MCG/ACT nasal spray Place into the nose.     Fluticasone-Salmeterol (ADVAIR) 250-50 MCG/DOSE AEPB Inhale 1 puff into the lungs daily.     guaiFENesin (MUCINEX) 600 MG 12 hr tablet Take 600 mg  by mouth daily as needed.     hydrochlorothiazide (HYDRODIURIL) 25 MG tablet Take 25 mg by mouth daily.     loratadine (CLARITIN) 10 MG tablet Take by mouth.     Multiple Vitamin (MULTIVITAMIN) capsule Take 1 capsule by mouth daily.     olmesartan (BENICAR) 20 MG tablet Take 20 mg by mouth daily.     rosuvastatin (CRESTOR) 10 MG tablet      No current facility-administered medications for this visit.         Assessment & Plan    Hypertension Assessment: BP is controlled in office BP 118/75 mmHg;  above the goal (<130/80). Tolerates olmesartan and hydrochlorothiazide well without any side effects Denies SOB, palpitation, chest pain, headaches,or swelling Reiterated the importance of  regular exercise and low salt diet   Plan:  Continue taking olmesartan 20 mg every day, hydrochlorothiazide 25 mg every day  Patient to keep record of BP readings about 3 days per week and average readings occasionally to be sure still < 130/80 Patient to follow up with Dr. Raford  in spring  Labs ordered today:  none   Allean Mink PharmD CPP [facility]  [address] [place], [place] [address] [phone]

## 2023-03-31 ENCOUNTER — Encounter: Payer: Medicare Other | Admitting: Physical Therapy

## 2023-04-07 ENCOUNTER — Encounter (HOSPITAL_BASED_OUTPATIENT_CLINIC_OR_DEPARTMENT_OTHER): Payer: Self-pay | Admitting: Cardiovascular Disease

## 2023-04-07 ENCOUNTER — Encounter: Payer: Medicare Other | Admitting: Physical Therapy

## 2023-05-20 ENCOUNTER — Encounter: Payer: BLUE CROSS/BLUE SHIELD | Admitting: Physical Therapy

## 2023-06-05 ENCOUNTER — Encounter: Payer: BLUE CROSS/BLUE SHIELD | Admitting: Physical Therapy

## 2023-06-12 ENCOUNTER — Other Ambulatory Visit

## 2023-06-12 ENCOUNTER — Encounter: Payer: BLUE CROSS/BLUE SHIELD | Admitting: Physical Therapy

## 2023-06-12 DIAGNOSIS — Z006 Encounter for examination for normal comparison and control in clinical research program: Secondary | ICD-10-CM

## 2023-06-17 HISTORY — PX: SHOULDER SURGERY: SHX246

## 2023-06-24 LAB — GENECONNECT MOLECULAR SCREEN: Genetic Analysis Overall Interpretation: NEGATIVE

## 2023-07-31 ENCOUNTER — Encounter (INDEPENDENT_AMBULATORY_CARE_PROVIDER_SITE_OTHER): Payer: Self-pay | Admitting: Otolaryngology

## 2023-07-31 ENCOUNTER — Ambulatory Visit (INDEPENDENT_AMBULATORY_CARE_PROVIDER_SITE_OTHER): Payer: BLUE CROSS/BLUE SHIELD | Admitting: Otolaryngology

## 2023-07-31 VITALS — BP 138/79 | HR 79

## 2023-07-31 DIAGNOSIS — H6980 Other specified disorders of Eustachian tube, unspecified ear: Secondary | ICD-10-CM

## 2023-07-31 DIAGNOSIS — J322 Chronic ethmoidal sinusitis: Secondary | ICD-10-CM

## 2023-07-31 DIAGNOSIS — J343 Hypertrophy of nasal turbinates: Secondary | ICD-10-CM

## 2023-07-31 DIAGNOSIS — J320 Chronic maxillary sinusitis: Secondary | ICD-10-CM | POA: Diagnosis not present

## 2023-07-31 DIAGNOSIS — H6983 Other specified disorders of Eustachian tube, bilateral: Secondary | ICD-10-CM

## 2023-07-31 DIAGNOSIS — R0981 Nasal congestion: Secondary | ICD-10-CM

## 2023-07-31 DIAGNOSIS — J310 Chronic rhinitis: Secondary | ICD-10-CM

## 2023-08-02 DIAGNOSIS — H6983 Other specified disorders of Eustachian tube, bilateral: Secondary | ICD-10-CM | POA: Insufficient documentation

## 2023-08-02 NOTE — Progress Notes (Signed)
 Follow-up: Chronic rhinosinusitis, chronic nasal congestion  HPI: The patient is a 73 year old female who returns today complaining of bilateral ear congestion.  She has a history of chronic rhinosinusitis.  At her last visit in January 2025, her chronic maxillary and ethmoid sinusitis were under control.  She was continued on mupirocin/budesonide nasal irrigation, Flonase, Mucinex, and Claritin.  The patient returns today complaining of an episode of upper respiratory infection in April during a trip to [place].  Since then, she has noted clogging and pressure sensation in her ears.  She denies any facial pain, fever, or visual change.  She denies any significant otalgia, otorrhea, or changes in her hearing.  Exam: General: Communicates without difficulty, well nourished, no acute distress. Head: Normocephalic, no evidence injury, no tenderness, facial buttresses intact without stepoff. Face/sinus: No tenderness to palpation and percussion. Facial movement is normal and symmetric. Eyes: PERRL, EOMI. No scleral icterus, conjunctivae clear. Neuro: CN II exam reveals vision grossly intact.  No nystagmus at any point of gaze. Ears: Auricles well formed without lesions.  Ear canals are intact without mass or lesion.  No erythema or edema is appreciated.  The TMs are intact without fluid. Nose: External evaluation reveals normal support and skin without lesions.  Dorsum is intact.  Anterior rhinoscopy reveals congested mucosa over anterior aspect of inferior turbinates and intact septum.  No purulence noted. Oral:  Oral cavity and oropharynx are intact, symmetric, without erythema or edema.  Mucosa is moist without lesions. Neck: Full range of motion without pain.  There is no significant lymphadenopathy.  No masses palpable.  Thyroid  bed within normal limits to palpation.  Parotid glands and submandibular glands equal bilaterally without mass.  Trachea is midline. Neuro:  CN 2-12 grossly intact.     Assessment: 1.  Clinical eustachian tube dysfunction. 2.  Chronic maxillary and ethmoid sinusitis.  No acute infection is noted today. 3.  Chronic rhinitis with nasal mucosal congestion and bilateral inferior turbinate hypertrophy.  Plan: 1.  The physical exam findings are reviewed with the patient. 2.  Flonase nasal spray 2 sprays each nostril daily.  The importance of consistent daily use is discussed. 3.  Valsalva exercise multiple times a day. 4.  Continue with mupirocin/budesonide nasal irrigation. 5.  The patient will return for reevaluation in 4 months, sooner if needed.

## 2023-08-12 NOTE — Progress Notes (Unsigned)
 Cardiology Office Note:  .   Date:  08/13/2023  ID:  Valerie Wilson, DOB 05/01/1950, MRN [id number] PCP: Azalia Leo, MD  [facility] Providers Cardiologist:  None    History of Present Illness: .    Valerie Wilson is a 73 y.o. female with mild aortic atherosclerosis, hypertension, hyperlipidemia, LBBB, moderate mitral regurgitation, mitral valve prolapse, history of uterine cancer and asthma here for follow up.   She was seen 01/2023 for the evaluation of palpitations.  In 2015 she was noted to have a  LBBB.  She had a nuclear stress in 2015 that reealed LVEF 76% and no ischemia.  She had an echo in 2022 that revealed LVEF 60-65% with mild LVH, grade 1 diastolic dysfunction and moderate MR 2/2 anterior leaflet prolapse.  Her family history is significant for heart disease, with both parents having suffered from it. The patient's father died of a heart attack at the age of 63, and the mother had multiple strokes. The patient's sister also has heart issues, and both brothers are overweight with high blood pressure. She wore a 30 day monitor that showed 1% PVCs and 2% PACs.     Discussed the use of AI scribe software for clinical note transcription with the patient, who gave verbal consent to proceed.  History of Present Illness Valerie Wilson notes that over the past six months, she has faced significant challenges following a meniscus tear in her knee and subsequent bicep and rotator cuff injury. She underwent knee surgery in January and shoulder surgery in mid-April. During this period, she experienced constant pain, which affected her mood and led to weight gain. Her shoulder has improved, allowing her to perform daily activities like washing her hair, but she lacks cardiovascular exercise and has gained weight.  She reports decreased stamina, managing stairs at home but requiring pauses when climbing multiple flights. Descending stairs was previously problematic due to knee pain, but this has  improved. No chest pain or pressure during these activities.  Her past medical history includes a mitral valve described as mildly to moderately leaky, identified in a December echocardiogram. She was initially referred due to heart murmurs, and a heart monitor indicated extra heartbeats, which she does not feel. She has a history of palpitations.  Her cholesterol levels were checked in March, showing a total cholesterol of 114, HDL of 35, and LDL of 39. She notes that her HDL has been low since her early thirties, which she attributes to genetics. She is currently on rosuvastatin for cholesterol management.  She has not been regularly checking her blood pressure at home but reports it was well-controlled previously. Her current medications include hydrochlorothiazide and rosuvastatin. She mentions having her four-year-old granddaughter stay with her recently, which disrupted her routine.  She lives in [place] and has a membership at a [employer], which she has not been utilizing fully due to her knee issues. She has been riding a bike for short periods but struggles with knee flexibility.  ROS:  As per HPI  Studies Reviewed: .       Echo 02/2023: 1. Left ventricular ejection fraction, by estimation, is 60 to 65%. The  left ventricle has normal function. The left ventricle has no regional  wall motion abnormalities. Left ventricular diastolic function could not  be evaluated.   2. Right ventricular systolic function is normal. The right ventricular  size is normal.   3. Left atrial size was mildly dilated.   4. The  mitral valve is normal in structure. Mild to moderate mitral valve  regurgitation. No evidence of mitral stenosis.   5. The aortic valve is tricuspid. Aortic valve regurgitation is not  visualized. No aortic stenosis is present.   6. The inferior vena cava is normal in size with greater than 50%  respiratory variability, suggesting right atrial pressure of 3 mmHg.    05/2023:  Total cholesterol 114, triglycerides 199, HDL of 35, and LDL of 39  Risk Assessment/Calculations:             Physical Exam:   VS:  BP 118/68 (BP Location: Left Arm, Patient Position: Sitting, Cuff Size: Large)   Pulse 79   Ht 5\' 4"  (1.626 m)   Wt 201 lb 9.6 oz (91.4 kg)   LMP 05/26/2007   SpO2 95%   BMI 34.60 kg/m  , BMI Body mass index is 34.6 kg/m. GENERAL:  Well appearing HEENT: Pupils equal round and reactive, fundi not visualized, oral mucosa unremarkable NECK:  No jugular venous distention, waveform within normal limits, carotid upstroke brisk and symmetric, no bruits, no thyromegaly LUNGS:  Clear to auscultation bilaterally HEART:  RRR.  PMI not displaced or sustained,S1 and S2 within normal limits, no S3, no S4, no clicks, no rubs, II/VI systoic murmur at the apex ABD:  Flat, positive bowel sounds normal in frequency in pitch, no bruits, no rebound, no guarding, no midline pulsatile mass, no hepatomegaly, no splenomegaly EXT:  2 plus pulses throughout, no edema, no cyanosis no clubbing SKIN:  No rashes no nodules NEURO:  Cranial nerves II through XII grossly intact, motor grossly intact throughout PSYCH:  Cognitively intact, oriented to person place and time   ASSESSMENT AND PLAN: .    Assessment & Plan # Mitral valve insufficiency: # Mitral valve prolapse: Mild to mild-moderate regurgitation, asymptomatic and well-managed. - Repeat echocardiogram in one year.  # Palpitations 1-2% PACs and PVCs.  She is not symptomatic.    # Hyperlipidemia:  # Hypertriglyceridemia # Aortic atherosclerosis: Triglycerides slightly elevated, total cholesterol and LDL controlled - Recommend exercise to improve HDL levels. -Limit carbohydrates -Continue rosuvastatin - Refer to PREP  # Obesity:  BMI 34.6.   Weight gain due to decreased activity post-injuries. Discussed need for improved diet and exercise for cardiovascular health. - Refer to [facility] Prep program for  diet and exercise support.      Dispo: f/u in 1 year  Signed, Maudine Sos, MD

## 2023-08-13 ENCOUNTER — Encounter (HOSPITAL_BASED_OUTPATIENT_CLINIC_OR_DEPARTMENT_OTHER): Payer: Self-pay | Admitting: Cardiovascular Disease

## 2023-08-13 ENCOUNTER — Ambulatory Visit (INDEPENDENT_AMBULATORY_CARE_PROVIDER_SITE_OTHER): Payer: BLUE CROSS/BLUE SHIELD | Admitting: Cardiovascular Disease

## 2023-08-13 VITALS — BP 118/68 | HR 79 | Ht 64.0 in | Wt 201.6 lb

## 2023-08-13 DIAGNOSIS — I700 Atherosclerosis of aorta: Secondary | ICD-10-CM | POA: Diagnosis not present

## 2023-08-13 DIAGNOSIS — I10 Essential (primary) hypertension: Secondary | ICD-10-CM | POA: Diagnosis not present

## 2023-08-13 DIAGNOSIS — I340 Nonrheumatic mitral (valve) insufficiency: Secondary | ICD-10-CM

## 2023-08-13 NOTE — Patient Instructions (Addendum)
 Medication Instructions:  Your physician recommends that you continue on your current medications as directed. Please refer to the Current Medication list given to you today.   *If you need a refill on your cardiac medications before your next appointment, please call your pharmacy*  Lab Work: NONE  Testing/Procedures: Your physician has requested that you have an echocardiogram. Echocardiography is a painless test that uses sound waves to create images of your heart. It provides your doctor with information about the size and shape of your heart and how well your heart's chambers and valves are working. This procedure takes approximately one hour. There are no restrictions for this procedure. Please do NOT wear cologne, perfume, aftershave, or lotions (deodorant is allowed). Please arrive 15 minutes prior to your appointment time.  Please note: We ask at that you not bring children with you during ultrasound (echo/ vascular) testing. Due to room size and safety concerns, children are not allowed in the ultrasound rooms during exams. Our front office staff cannot provide observation of children in our lobby area while testing is being conducted. An adult accompanying a patient to their appointment will only be allowed in the ultrasound room at the discretion of the ultrasound technician under special circumstances. We apologize for any inconvenience.  TO BE DONE IN 1 YEAR   Follow-Up: At [facility], you and your health needs are our priority.  As part of our continuing mission to provide you with exceptional heart care, our providers are all part of one team.  This team includes your primary Cardiologist (physician) and Advanced Practice Providers or APPs (Physician Assistants and Nurse Practitioners) who all work together to provide you with the care you need, when you need it.  Your next appointment:    1 YEAR ABOUT A WEEK AFTER ECHO   Provider:   Maudine Sos, MD, Slater Duncan, NP, or Neomi Banks, NP    We recommend signing up for the patient portal called "MyChart".  Sign up information is provided on this After Visit Summary.  MyChart is used to connect with patients for Virtual Visits (Telemedicine).  Patients are able to view lab/test results, encounter notes, upcoming appointments, etc.  Non-urgent messages can be sent to your provider as well.   To learn more about what you can do with MyChart, go to [email or web address].

## 2023-08-14 ENCOUNTER — Telehealth: Payer: Self-pay

## 2023-08-14 NOTE — Telephone Encounter (Signed)
 She returned my call, explained PREP, she is still in therapy for shoulder surgery; advised to finish PT first/receive clearance for exercise from ortho, then enroll in PREP; she thinks she will be finished at end of June, will tentatively plan on her starting in next July class, every T/Th at 70; will contact mid June to confirm and set up assessment visit.

## 2023-08-14 NOTE — Telephone Encounter (Signed)
 Called  re: PREP program referral, left voicemail requesting return call.

## 2023-09-11 ENCOUNTER — Telehealth: Payer: Self-pay

## 2023-09-11 NOTE — Telephone Encounter (Signed)
 Called to confirm participation in next PREP program at [facility] on July 8, left voicemail requesting return call.

## 2023-09-11 NOTE — Telephone Encounter (Signed)
 Returned her call, she shared that she will traveling for 2 weeks in July, returning early August, she wants to wait and start August 5 class at [facility], every T/Th at 12; assessment visit scheduled for 1:15 on August 5.

## 2023-10-27 ENCOUNTER — Telehealth: Payer: Self-pay

## 2023-10-27 NOTE — Telephone Encounter (Signed)
 Called to discuss need to re-schedule PREP class for 8/12, she would like to still attend [facility], wants the next M/W at noon; will ask [facility] contact her with date and schedule assessment visit.

## 2023-11-12 ENCOUNTER — Telehealth: Payer: Self-pay

## 2023-11-12 NOTE — Telephone Encounter (Signed)
 Left a message about upcoming class at [facility] starting Sept 8 and wanted to schedule her initial assessment. Asked her to return my call.

## 2023-11-20 ENCOUNTER — Telehealth: Payer: Self-pay

## 2023-11-20 NOTE — Telephone Encounter (Signed)
 Returned Valerie Wilson's call about needing to postpone to next [facility] class. I left her a message saying that there will be another one starting in November and we would call her with more information on dates.

## 2023-12-12 ENCOUNTER — Encounter (INDEPENDENT_AMBULATORY_CARE_PROVIDER_SITE_OTHER): Payer: Self-pay | Admitting: Otolaryngology

## 2023-12-12 ENCOUNTER — Ambulatory Visit (INDEPENDENT_AMBULATORY_CARE_PROVIDER_SITE_OTHER): Admitting: Otolaryngology

## 2023-12-12 VITALS — BP 114/69 | HR 56 | Temp 97.7°F | Ht 64.0 in | Wt 191.0 lb

## 2023-12-12 DIAGNOSIS — J320 Chronic maxillary sinusitis: Secondary | ICD-10-CM

## 2023-12-12 DIAGNOSIS — R0981 Nasal congestion: Secondary | ICD-10-CM

## 2023-12-12 DIAGNOSIS — H6990 Unspecified Eustachian tube disorder, unspecified ear: Secondary | ICD-10-CM | POA: Diagnosis not present

## 2023-12-12 DIAGNOSIS — J343 Hypertrophy of nasal turbinates: Secondary | ICD-10-CM

## 2023-12-12 DIAGNOSIS — J322 Chronic ethmoidal sinusitis: Secondary | ICD-10-CM

## 2023-12-12 NOTE — Progress Notes (Signed)
 Follow-up: Chronic rhinosinusitis, chronic nasal congestion  HPI: The patient is a 73 year old female who returns today for her follow-up evaluation.  She has a history of chronic rhinosinusitis.  At her last visit in May 2025, her chronic maxillary and ethmoid sinusitis were under control.  She was continued on mupirocin/budesonide nasal irrigation, Flonase, Mucinex, and antihistamine.  The patient returns today complaining of occasional clogging and pressure sensation in her ears.  She denies any recent sinusitis.  She denies any significant facial pain, fever, otalgia, otorrhea, or changes in her hearing.  Exam: General: Communicates without difficulty, well nourished, no acute distress. Head: Normocephalic, no evidence injury, no tenderness, facial buttresses intact without stepoff. Face/sinus: No tenderness to palpation and percussion. Facial movement is normal and symmetric. Eyes: PERRL, EOMI. No scleral icterus, conjunctivae clear. Neuro: CN II exam reveals vision grossly intact.  No nystagmus at any point of gaze. Ears: Auricles well formed without lesions.  Ear canals are intact without mass or lesion.  No erythema or edema is appreciated.  The TMs are intact without fluid. Nose: External evaluation reveals normal support and skin without lesions.  Dorsum is intact.  Anterior rhinoscopy reveals congested mucosa over anterior aspect of inferior turbinates and intact septum.  No purulence noted. Oral:  Oral cavity and oropharynx are intact, symmetric, without erythema or edema.  Mucosa is moist without lesions. Neck: Full range of motion without pain.  There is no significant lymphadenopathy.  No masses palpable.  Thyroid  bed within normal limits to palpation.  Parotid glands and submandibular glands equal bilaterally without mass.  Trachea is midline. Neuro:  CN 2-12 grossly intact.    Assessment: 1.  Clinical eustachian tube dysfunction. 2.  Chronic maxillary and ethmoid sinusitis.  No acute  infection is noted today. 3.  Chronic rhinitis with nasal mucosal congestion and bilateral inferior turbinate hypertrophy.  Plan: 1.  The physical exam findings are reviewed with the patient. 2.  Flonase nasal spray 2 sprays each nostril daily.   3.  Valsalva exercise multiple times a day. 4.  Continue with mupirocin/budesonide nasal irrigation. 5.  The patient will return for reevaluation in 6 months, sooner if needed.

## 2023-12-18 NOTE — Progress Notes (Signed)
 73 y.o. G63P0000 Married Caucasian female here for a breast and pelvic exam.    The patient is also followed for ASCUS paps.  No vaginal bleeding, pelvic pain, or vaginal discharge.    She has some urinary control issues.  Will leak with a cough.   Wears a pad.   She did pelvic floor therapy at [facility], and would like to resume.    Having vaginal dryness.   Uses OTC  Vit E, and would like a prescription for this.    Had knee surgery in January and shoulder surgery in April.   PCP: Stephane Leita DEL, MD   Patient's last menstrual period was 05/26/2007.           Sexually active: Yes.    The current method of family planning is Vasectomy/TAH/BSO.    Menopausal hormone therapy:  n/a Exercising: Yes.    Yard work and walking  Smoker:  no  OB History     Gravida  0   Para  0   Term  0   Preterm  0   AB  0   Living  0      SAB  0   IAB  0   Ectopic  0   Multiple  0   Live Births              HEALTH MAINTENANCE: Last 2 paps: 12/18/22 ASCUS, HR HPV neg, 01/05/20 ASCUS, HR HPV neg  History of abnormal Pap or positive HPV:  yes, hx ASCUS paps.  Colpo 2021 - atrophy.  Colpo 2024 - benign.  Mammogram:  12/26/22 Breast Density Cat B, BIRADS Cat 1 neg.  She has an appointment.     Colonoscopy:  06/16/19 Bone Density:  12/02/18  Result  normal.  PCP ordered for [facility].    Immunization History  Administered Date(s) Administered   INFLUENZA, HIGH DOSE SEASONAL PF 12/02/2016, 12/10/2017, 01/05/2019   Influenza Split 12/17/2015   Influenza-Unspecified 12/17/2015   PFIZER(Purple Top)SARS-COV-2 Vaccination 11/27/2018, 12/17/2018, 10/04/2019   Pneumococcal Conjugate-13 12/15/2013   Pneumococcal Polysaccharide-23 02/20/2011   Tdap 04/12/2013   Zoster, Live 04/12/2013      reports that she has never smoked. She has never used smokeless tobacco. She reports current alcohol use. She reports that she does not use drugs.  Past Medical History:  Diagnosis Date   Aortic  atherosclerosis 02/10/2023   ASCUS on Pap smear 03/18/1992   x2 - colpo   Cancer (HCC) 03/19/2007   endometrial adenocarcinoma   Diverticulosis    citrucel   Diverticulosis    Fibroid 03/18/1996   fundal 1.5 cm   Gallstone    Heart murmur    Hypertension    LBBB (left bundle branch block) 02/10/2023   Mitral regurgitation 02/10/2023   Osteoarthritis of hand    --right   Pancreatitis    PID (pelvic inflammatory disease) 03/18/1984   laparoscopy   Pure hypercholesterolemia 02/10/2023    Past Surgical History:  Procedure Laterality Date   ABDOMINAL HYSTERECTOMY  05/26/2007   TAH/BSO- Grade 1 endometrial ad   BLEPHAROPLASTY  09/1998   CARPAL TUNNEL RELEASE Right 2004   Dr. Sissy   CHOLECYSTECTOMY     CHOLECYSTECTOMY, LAPAROSCOPIC  11/2013   --Dr. [facility]   COLONOSCOPY     KNEE SURGERY  03/2023   NASAL SEPTUM SURGERY     PLANTAR FASCIA RELEASE Right    SHOULDER SURGERY  06/2023   TOOTH EXTRACTION  Current Outpatient Medications  Medication Sig Dispense Refill   albuterol (PROVENTIL HFA;VENTOLIN HFA) 108 (90 Base) MCG/ACT inhaler Inhale into the lungs as needed.     ALPRAZolam (XANAX) 0.5 MG tablet Take 0.5 mg by mouth 3 (three) times daily as needed for anxiety. Patient takes when travels     citalopram (CELEXA) 10 MG tablet Take 10 mg by mouth daily as needed.     fluticasone (FLONASE) 50 MCG/ACT nasal spray Place into the nose.     Fluticasone-Salmeterol (ADVAIR) 250-50 MCG/DOSE AEPB Inhale 1 puff into the lungs daily.     guaiFENesin (MUCINEX) 600 MG 12 hr tablet Take 600 mg by mouth daily as needed.     hydrochlorothiazide (HYDRODIURIL) 25 MG tablet Take 25 mg by mouth daily.     loratadine (CLARITIN) 10 MG tablet Take by mouth.     Multiple Vitamin (MULTIVITAMIN) capsule Take 1 capsule by mouth daily.     olmesartan (BENICAR) 20 MG tablet Take 20 mg by mouth daily.     rosuvastatin (CRESTOR) 10 MG tablet      No current  facility-administered medications for this visit.    ALLERGIES: Hydrocodone-acetaminophen  Family History  Problem Relation Age of Onset   Diabetes Mother    Hypertension Mother    Stroke Mother 67   Heart attack Father 52   Hypertension Father    Hypertension Sister    Thyroid  disease Sister    Heart murmur Sister    Hypertension Brother    Hypertension Brother    Esophageal cancer Neg Hx    Colon cancer Neg Hx    Rectal cancer Neg Hx    Stomach cancer Neg Hx     Review of Systems  All other systems reviewed and are negative.   PHYSICAL EXAM:  BP 116/68 (BP Location: Left Arm, Patient Position: Sitting)   Pulse 61   Ht 5' 4.5 (1.638 m)   Wt 193 lb (87.5 kg)   LMP 05/26/2007   SpO2 97%   BMI 32.62 kg/m     General appearance: alert, cooperative and appears stated age Head: normocephalic, without obvious abnormality, atraumatic Neck: no adenopathy, supple, symmetrical, trachea midline and thyroid  normal to inspection and palpation Lungs: clear to auscultation bilaterally Breasts: normal appearance, no masses or tenderness, No nipple retraction or dimpling, No nipple discharge or bleeding, No axillary adenopathy Heart: regular rate and rhythm Abdomen: soft, non-tender; no masses, no organomegaly Extremities: extremities normal, atraumatic, no cyanosis or edema Skin: skin color, texture, turgor normal. No rashes or lesions Lymph nodes: cervical, supraclavicular, and axillary nodes normal. Neurologic: grossly normal  Pelvic: External genitalia:  no lesions              No abnormal inguinal nodes palpated.              Urethra:  normal appearing urethra with no masses, tenderness or lesions              Bartholins and Skenes: normal                 Vagina: normal appearing vagina with normal color and discharge, no lesions.  Atrophy noted.               Cervix: absent              Pap taken: yes Bimanual Exam:  Uterus:  absent.              Adnexa: no mass,  fullness, tenderness  Rectal exam: yes.  Confirms.  Fissures of the superior perianal region.              Anus:  normal sphincter tone, no lesions  Chaperone was present for exam:  Kari HERO, CMA  ASSESSMENT: Encounter for breast and pelvic exam.  Personal risk factors not otherwise specified.  Status post TAH/BSO for endometrial CA.  FIGO IB.  2009.  No evidence of recurrence.  Hx ASCUS paps, colpos benign.  Atrophy clinically.  Perianal fissures.   PLAN: Mammogram screening discussed. Self breast awareness reviewed. Pap and HRV collected:  yes. Paps and colpos reviewed.  Guidelines for Calcium, Vitamin D, regular exercise program including cardiovascular and weight bearing exercise. Medication refills:  Vaginal vit E suppositories, triamcinolone  ointme If anal fissures do not resolve, return to office.  Labs with PCP.  BMD planned at [facility].  Follow up:  yearly and prn.     Additional counseling given.  yes. 20 min total time was spent for this patient encounter, including preparation, face-to-face counseling with the patient, coordination of care, and documentation of the encounter in addition to doing the breast and pelvic exam.   Two prescriptions for vit E suppositories and for triamcinolone  ointment.

## 2023-12-22 ENCOUNTER — Other Ambulatory Visit (HOSPITAL_COMMUNITY)
Admission: RE | Admit: 2023-12-22 | Discharge: 2023-12-22 | Disposition: A | Discharge status: Home / Self Care | Source: Ambulatory Visit | Attending: Obstetrics and Gynecology | Admitting: Obstetrics and Gynecology

## 2023-12-22 ENCOUNTER — Ambulatory Visit (INDEPENDENT_AMBULATORY_CARE_PROVIDER_SITE_OTHER): Payer: Medicare Other | Admitting: Obstetrics and Gynecology

## 2023-12-22 ENCOUNTER — Encounter: Payer: Self-pay | Admitting: Obstetrics and Gynecology

## 2023-12-22 VITALS — BP 116/68 | HR 61 | Ht 64.5 in | Wt 193.0 lb

## 2023-12-22 DIAGNOSIS — Z124 Encounter for screening for malignant neoplasm of cervix: Secondary | ICD-10-CM | POA: Insufficient documentation

## 2023-12-22 DIAGNOSIS — Z1272 Encounter for screening for malignant neoplasm of vagina: Secondary | ICD-10-CM

## 2023-12-22 DIAGNOSIS — Z1151 Encounter for screening for human papillomavirus (HPV): Secondary | ICD-10-CM | POA: Insufficient documentation

## 2023-12-22 DIAGNOSIS — Z8542 Personal history of malignant neoplasm of other parts of uterus: Secondary | ICD-10-CM | POA: Diagnosis present

## 2023-12-22 DIAGNOSIS — R87610 Atypical squamous cells of undetermined significance on cytologic smear of cervix (ASC-US): Secondary | ICD-10-CM

## 2023-12-22 DIAGNOSIS — N952 Postmenopausal atrophic vaginitis: Secondary | ICD-10-CM

## 2023-12-22 DIAGNOSIS — Z9189 Other specified personal risk factors, not elsewhere classified: Secondary | ICD-10-CM

## 2023-12-22 DIAGNOSIS — Z01419 Encounter for gynecological examination (general) (routine) without abnormal findings: Secondary | ICD-10-CM

## 2023-12-22 MED ORDER — TRIAMCINOLONE ACETONIDE 0.025 % EX OINT: 1.0000 | TOPICAL_OINTMENT | Freq: Two times a day (BID) | CUTANEOUS | 0 refills | Status: AC

## 2023-12-22 MED ORDER — NONFORMULARY OR COMPOUNDED ITEM: 3 refills | Status: AC

## 2023-12-22 NOTE — Patient Instructions (Signed)

## 2023-12-23 LAB — CYTOLOGY - PAP
Comment: NEGATIVE
Diagnosis: UNDETERMINED — AB
High risk HPV: NEGATIVE

## 2023-12-24 ENCOUNTER — Ambulatory Visit: Payer: Self-pay | Admitting: Obstetrics and Gynecology

## 2024-01-05 ENCOUNTER — Telehealth: Payer: Self-pay

## 2024-01-05 NOTE — Telephone Encounter (Signed)
 Left message about upcoming PREP class at [facility] and asked her to return my call.

## 2024-01-13 ENCOUNTER — Telehealth: Payer: Self-pay

## 2024-01-13 NOTE — Telephone Encounter (Signed)
 Talked with Valerie Wilson about joining next PREP class which will start November 10. We scheduled her initial assessment for November 3 10:30 as well.

## 2024-02-02 LAB — HM MAMMOGRAPHY

## 2024-02-02 LAB — HM DEXA SCAN: HM Dexa Scan: NORMAL

## 2024-02-04 ENCOUNTER — Encounter: Payer: Self-pay | Admitting: Obstetrics and Gynecology

## 2024-02-05 ENCOUNTER — Ambulatory Visit: Payer: Self-pay | Admitting: Obstetrics and Gynecology

## 2024-03-25 ENCOUNTER — Telehealth (INDEPENDENT_AMBULATORY_CARE_PROVIDER_SITE_OTHER): Payer: Self-pay | Admitting: Otolaryngology

## 2024-03-25 NOTE — Telephone Encounter (Signed)
 Prescription nasal rinse ( Mupirocin 20 mg/ Mometasone 06 mg/ capsule) with 5 total refill was faxed to [facility].

## 2024-06-11 ENCOUNTER — Ambulatory Visit (INDEPENDENT_AMBULATORY_CARE_PROVIDER_SITE_OTHER): Admitting: Otolaryngology

## 2024-08-02 ENCOUNTER — Other Ambulatory Visit (HOSPITAL_BASED_OUTPATIENT_CLINIC_OR_DEPARTMENT_OTHER)

## 2024-08-12 ENCOUNTER — Ambulatory Visit (HOSPITAL_BASED_OUTPATIENT_CLINIC_OR_DEPARTMENT_OTHER): Admitting: Cardiovascular Disease

## 2024-12-22 ENCOUNTER — Encounter: Admitting: Obstetrics and Gynecology
